# Patient Record
Sex: Female | Born: 1935 | Race: White | Hispanic: No | Smoking: Never smoker
Health system: Southern US, Community
[De-identification: ages and names within clinical notes are randomized; demographics above are authoritative.]

## PROBLEM LIST (undated history)

## (undated) DIAGNOSIS — Z9851 Tubal ligation status: Secondary | ICD-10-CM

## (undated) DIAGNOSIS — I2510 Atherosclerotic heart disease of native coronary artery without angina pectoris: Secondary | ICD-10-CM

## (undated) DIAGNOSIS — K449 Diaphragmatic hernia without obstruction or gangrene: Secondary | ICD-10-CM

## (undated) DIAGNOSIS — R58 Hemorrhage, not elsewhere classified: Secondary | ICD-10-CM

## (undated) DIAGNOSIS — I219 Acute myocardial infarction, unspecified: Secondary | ICD-10-CM

## (undated) DIAGNOSIS — I10 Essential (primary) hypertension: Secondary | ICD-10-CM

## (undated) DIAGNOSIS — Z923 Personal history of irradiation: Secondary | ICD-10-CM

## (undated) DIAGNOSIS — Z9862 Peripheral vascular angioplasty status: Secondary | ICD-10-CM

## (undated) DIAGNOSIS — M545 Low back pain, unspecified: Secondary | ICD-10-CM

## (undated) DIAGNOSIS — C719 Malignant neoplasm of brain, unspecified: Secondary | ICD-10-CM

## (undated) DIAGNOSIS — E7800 Pure hypercholesterolemia, unspecified: Secondary | ICD-10-CM

## (undated) DIAGNOSIS — C9000 Multiple myeloma not having achieved remission: Secondary | ICD-10-CM

## (undated) DIAGNOSIS — C439 Malignant melanoma of skin, unspecified: Secondary | ICD-10-CM

## (undated) DIAGNOSIS — E785 Hyperlipidemia, unspecified: Secondary | ICD-10-CM

## (undated) DIAGNOSIS — D649 Anemia, unspecified: Secondary | ICD-10-CM

## (undated) DIAGNOSIS — R600 Localized edema: Secondary | ICD-10-CM

## (undated) DIAGNOSIS — E789 Disorder of lipoprotein metabolism, unspecified: Secondary | ICD-10-CM

## (undated) HISTORY — DX: Atherosclerotic heart disease of native coronary artery without angina pectoris: I25.10

## (undated) HISTORY — DX: Tubal ligation status: Z98.51

## (undated) HISTORY — DX: Peripheral vascular angioplasty status: Z98.62

## (undated) HISTORY — DX: Hemorrhage, not elsewhere classified: R58

## (undated) HISTORY — DX: Anemia, unspecified: D64.9

## (undated) HISTORY — DX: Low back pain: M54.5

## (undated) HISTORY — DX: Low back pain, unspecified: M54.50

## (undated) HISTORY — DX: Diaphragmatic hernia without obstruction or gangrene: K44.9

## (undated) HISTORY — DX: Personal history of irradiation: Z92.3

## (undated) HISTORY — PX: OTHER SURGICAL HISTORY: SHX169

## (undated) HISTORY — DX: Localized edema: R60.0

## (undated) HISTORY — DX: Essential (primary) hypertension: I10

## (undated) HISTORY — DX: Hyperlipidemia, unspecified: E78.5

## (undated) HISTORY — PX: ABDOMINAL HYSTERECTOMY: SHX81

## (undated) HISTORY — DX: Malignant neoplasm of brain, unspecified: C71.9

---

## 2007-05-05 DIAGNOSIS — Z9862 Peripheral vascular angioplasty status: Secondary | ICD-10-CM

## 2007-05-05 HISTORY — DX: Peripheral vascular angioplasty status: Z98.62

## 2008-02-01 ENCOUNTER — Other Ambulatory Visit: Payer: Self-pay

## 2008-02-01 ENCOUNTER — Emergency Department: Payer: Self-pay | Admitting: Emergency Medicine

## 2010-02-01 ENCOUNTER — Ambulatory Visit: Payer: Self-pay | Admitting: Oncology

## 2010-02-07 ENCOUNTER — Ambulatory Visit: Payer: Self-pay | Admitting: Oncology

## 2010-02-10 ENCOUNTER — Ambulatory Visit: Payer: Self-pay | Admitting: Oncology

## 2010-03-04 ENCOUNTER — Ambulatory Visit: Payer: Self-pay | Admitting: Oncology

## 2010-05-12 ENCOUNTER — Inpatient Hospital Stay (HOSPITAL_COMMUNITY)
Admission: EM | Admit: 2010-05-12 | Discharge: 2010-05-13 | Payer: Self-pay | Source: Home / Self Care | Attending: Internal Medicine | Admitting: Internal Medicine

## 2010-05-19 LAB — POCT I-STAT, CHEM 8
BUN: 25 mg/dL — ABNORMAL HIGH (ref 6–23)
Calcium, Ion: 1.12 mmol/L (ref 1.12–1.32)
Chloride: 108 mEq/L (ref 96–112)
Creatinine, Ser: 1.4 mg/dL — ABNORMAL HIGH (ref 0.4–1.2)
Glucose, Bld: 129 mg/dL — ABNORMAL HIGH (ref 70–99)
HCT: 36 % (ref 36.0–46.0)
Hemoglobin: 12.2 g/dL (ref 12.0–15.0)
Potassium: 4 mEq/L (ref 3.5–5.1)
Sodium: 142 mEq/L (ref 135–145)
TCO2: 23 mmol/L (ref 0–100)

## 2010-05-19 LAB — COMPREHENSIVE METABOLIC PANEL
ALT: 12 U/L (ref 0–35)
AST: 18 U/L (ref 0–37)
Albumin: 3 g/dL — ABNORMAL LOW (ref 3.5–5.2)
Alkaline Phosphatase: 66 U/L (ref 39–117)
BUN: 21 mg/dL (ref 6–23)
CO2: 26 mEq/L (ref 19–32)
Calcium: 8.8 mg/dL (ref 8.4–10.5)
Chloride: 103 mEq/L (ref 96–112)
Creatinine, Ser: 1.37 mg/dL — ABNORMAL HIGH (ref 0.4–1.2)
GFR calc Af Amer: 45 mL/min — ABNORMAL LOW (ref >60)
GFR calc non Af Amer: 37 mL/min — ABNORMAL LOW (ref >60)
Glucose, Bld: 154 mg/dL — ABNORMAL HIGH (ref 70–99)
Potassium: 3.8 mEq/L (ref 3.5–5.1)
Sodium: 139 mEq/L (ref 135–145)
Total Bilirubin: 0.4 mg/dL (ref 0.3–1.2)
Total Protein: 6.2 g/dL (ref 6.0–8.3)

## 2010-05-19 LAB — CBC
HCT: 35.8 % — ABNORMAL LOW (ref 36.0–46.0)
HCT: 33.7 % — ABNORMAL LOW (ref 36.0–46.0)
Hemoglobin: 11.7 g/dL — ABNORMAL LOW (ref 12.0–15.0)
Hemoglobin: 11.1 g/dL — ABNORMAL LOW (ref 12.0–15.0)
MCH: 29.6 pg (ref 26.0–34.0)
MCH: 29.5 pg (ref 26.0–34.0)
MCHC: 32.7 g/dL (ref 30.0–36.0)
MCHC: 32.9 g/dL (ref 30.0–36.0)
MCV: 90.6 fL (ref 78.0–100.0)
MCV: 89.6 fL (ref 78.0–100.0)
Platelets: 213 10*3/uL (ref 150–400)
Platelets: 191 10*3/uL (ref 150–400)
RBC: 3.95 MIL/uL (ref 3.87–5.11)
RBC: 3.76 MIL/uL — ABNORMAL LOW (ref 3.87–5.11)
RDW: 13.1 % (ref 11.5–15.5)
RDW: 13 % (ref 11.5–15.5)
WBC: 7.5 10*3/uL (ref 4.0–10.5)
WBC: 5.8 10*3/uL (ref 4.0–10.5)

## 2010-05-19 LAB — PROTIME-INR
INR: 0.83 (ref 0.00–1.49)
Prothrombin Time: 11.6 seconds (ref 11.6–15.2)

## 2010-05-19 LAB — BASIC METABOLIC PANEL
BUN: 22 mg/dL (ref 6–23)
CO2: 23 mEq/L (ref 19–32)
Calcium: 9.1 mg/dL (ref 8.4–10.5)
Chloride: 108 mEq/L (ref 96–112)
Creatinine, Ser: 1.25 mg/dL — ABNORMAL HIGH (ref 0.4–1.2)
GFR calc Af Amer: 50 mL/min — ABNORMAL LOW (ref >60)
GFR calc non Af Amer: 42 mL/min — ABNORMAL LOW (ref >60)
Glucose, Bld: 133 mg/dL — ABNORMAL HIGH (ref 70–99)
Potassium: 4 mEq/L (ref 3.5–5.1)
Sodium: 140 mEq/L (ref 135–145)

## 2010-05-19 LAB — DIFFERENTIAL
Eosinophils Absolute: 0.5 10*3/uL (ref 0.0–0.7)
Eosinophils Relative: 6 % — ABNORMAL HIGH (ref 0–5)
Lymphs Abs: 2.8 10*3/uL (ref 0.7–4.0)
Monocytes Absolute: 0.7 10*3/uL (ref 0.1–1.0)
Monocytes Relative: 10 % (ref 3–12)
Neutrophils Relative %: 46 % (ref 43–77)

## 2010-05-19 LAB — CARDIAC PANEL(CRET KIN+CKTOT+MB+TROPI)
CK, MB: 3.8 ng/mL (ref 0.3–4.0)
CK, MB: 3.2 ng/mL (ref 0.3–4.0)
CK, MB: 2.9 ng/mL (ref 0.3–4.0)
Relative Index: INVALID (ref 0.0–2.5)
Relative Index: INVALID (ref 0.0–2.5)
Relative Index: INVALID (ref 0.0–2.5)
Total CK: 77 U/L (ref 7–177)
Total CK: 77 U/L (ref 7–177)
Total CK: 63 U/L (ref 7–177)
Troponin I: 0.02 ng/mL (ref 0.00–0.06)
Troponin I: 0.01 ng/mL (ref 0.00–0.06)
Troponin I: 0.02 ng/mL (ref 0.00–0.06)

## 2010-05-19 LAB — APTT: aPTT: 28 seconds (ref 24–37)

## 2010-05-19 LAB — POCT CARDIAC MARKERS
CKMB, poc: 2.5 ng/mL (ref 1.0–8.0)
Myoglobin, poc: 148 ng/mL (ref 12–200)
Troponin i, poc: <0.05 ng/mL (ref 0.00–0.09)

## 2010-05-19 LAB — GLUCOSE, CAPILLARY
Glucose-Capillary: 113 mg/dL — ABNORMAL HIGH (ref 70–99)
Glucose-Capillary: 140 mg/dL — ABNORMAL HIGH (ref 70–99)
Glucose-Capillary: 144 mg/dL — ABNORMAL HIGH (ref 70–99)
Glucose-Capillary: 153 mg/dL — ABNORMAL HIGH (ref 70–99)
Glucose-Capillary: 113 mg/dL — ABNORMAL HIGH (ref 70–99)
Glucose-Capillary: 121 mg/dL — ABNORMAL HIGH (ref 70–99)

## 2010-05-19 LAB — RAPID STREP SCREEN (MED CTR MEBANE ONLY): Streptococcus, Group A Screen (Direct): NEGATIVE

## 2010-05-19 LAB — BRAIN NATRIURETIC PEPTIDE: Pro B Natriuretic peptide (BNP): 100 pg/mL (ref 0.0–100.0)

## 2010-10-16 ENCOUNTER — Observation Stay (HOSPITAL_COMMUNITY)
Admission: EM | Admit: 2010-10-16 | Discharge: 2010-10-18 | Disposition: A | Discharge status: Home / Self Care | Payer: Medicare Other | Attending: Surgery | Admitting: Surgery

## 2010-10-16 ENCOUNTER — Inpatient Hospital Stay (INDEPENDENT_AMBULATORY_CARE_PROVIDER_SITE_OTHER)
Admission: RE | Admit: 2010-10-16 | Discharge: 2010-10-16 | Disposition: A | Discharge status: Home / Self Care | Payer: Medicare Other | Source: Ambulatory Visit | Attending: Family Medicine | Admitting: Family Medicine

## 2010-10-16 ENCOUNTER — Encounter (HOSPITAL_COMMUNITY): Payer: Self-pay | Admitting: Radiology

## 2010-10-16 ENCOUNTER — Emergency Department (HOSPITAL_COMMUNITY): Payer: Medicare Other

## 2010-10-16 DIAGNOSIS — R58 Hemorrhage, not elsewhere classified: Principal | ICD-10-CM | POA: Insufficient documentation

## 2010-10-16 DIAGNOSIS — Z85820 Personal history of malignant melanoma of skin: Secondary | ICD-10-CM | POA: Insufficient documentation

## 2010-10-16 DIAGNOSIS — E669 Obesity, unspecified: Secondary | ICD-10-CM | POA: Insufficient documentation

## 2010-10-16 DIAGNOSIS — R1031 Right lower quadrant pain: Secondary | ICD-10-CM | POA: Insufficient documentation

## 2010-10-16 DIAGNOSIS — C439 Malignant melanoma of skin, unspecified: Secondary | ICD-10-CM

## 2010-10-16 DIAGNOSIS — E279 Disorder of adrenal gland, unspecified: Secondary | ICD-10-CM | POA: Insufficient documentation

## 2010-10-16 DIAGNOSIS — D62 Acute posthemorrhagic anemia: Secondary | ICD-10-CM | POA: Insufficient documentation

## 2010-10-16 DIAGNOSIS — R609 Edema, unspecified: Secondary | ICD-10-CM | POA: Insufficient documentation

## 2010-10-16 DIAGNOSIS — R599 Enlarged lymph nodes, unspecified: Secondary | ICD-10-CM | POA: Insufficient documentation

## 2010-10-16 DIAGNOSIS — I10 Essential (primary) hypertension: Secondary | ICD-10-CM | POA: Insufficient documentation

## 2010-10-16 DIAGNOSIS — Z79899 Other long term (current) drug therapy: Secondary | ICD-10-CM | POA: Insufficient documentation

## 2010-10-16 HISTORY — DX: Acute myocardial infarction, unspecified: I21.9

## 2010-10-16 HISTORY — DX: Pure hypercholesterolemia, unspecified: E78.00

## 2010-10-16 HISTORY — DX: Disorder of lipoprotein metabolism, unspecified: E78.9

## 2010-10-16 HISTORY — DX: Malignant melanoma of skin, unspecified: C43.9

## 2010-10-16 LAB — POCT URINALYSIS DIP (DEVICE)
Glucose, UA: NEGATIVE mg/dL
Ketones, ur: NEGATIVE mg/dL
Leukocytes, UA: NEGATIVE
Nitrite: NEGATIVE
Protein, ur: 30 mg/dL — AB
Specific Gravity, Urine: 1.02 (ref 1.005–1.030)
Urobilinogen, UA: 0.2 mg/dL (ref 0.0–1.0)
pH: 5 (ref 5.0–8.0)

## 2010-10-16 LAB — URINALYSIS, ROUTINE W REFLEX MICROSCOPIC
Hgb urine dipstick: NEGATIVE
Nitrite: NEGATIVE
Protein, ur: NEGATIVE mg/dL
Specific Gravity, Urine: 1.023 (ref 1.005–1.030)
Urobilinogen, UA: 0.2 mg/dL (ref 0.0–1.0)

## 2010-10-16 LAB — COMPREHENSIVE METABOLIC PANEL
ALT: 12 U/L (ref 0–35)
AST: 21 U/L (ref 0–37)
CO2: 26 mEq/L (ref 19–32)
Calcium: 9.4 mg/dL (ref 8.4–10.5)
Chloride: 102 mEq/L (ref 96–112)
Creatinine, Ser: 0.98 mg/dL (ref 0.4–1.2)
GFR calc Af Amer: >60 mL/min (ref >60)
GFR calc non Af Amer: 55 mL/min — ABNORMAL LOW (ref >60)
Glucose, Bld: 108 mg/dL — ABNORMAL HIGH (ref 70–99)
Total Bilirubin: 0.4 mg/dL (ref 0.3–1.2)

## 2010-10-16 LAB — CBC
Hemoglobin: 11.5 g/dL — ABNORMAL LOW (ref 12.0–15.0)
MCH: 29 pg (ref 26.0–34.0)
MCV: 85.6 fL (ref 78.0–100.0)
RBC: 3.97 MIL/uL (ref 3.87–5.11)
WBC: 9.4 10*3/uL (ref 4.0–10.5)

## 2010-10-16 LAB — TYPE AND SCREEN
ABO/RH(D): O NEG
Antibody Screen: POSITIVE
DAT, IgG: NEGATIVE

## 2010-10-16 LAB — APTT: aPTT: 30 seconds (ref 24–37)

## 2010-10-16 LAB — PROTIME-INR: INR: 0.98 (ref 0.00–1.49)

## 2010-10-16 LAB — URINE MICROSCOPIC-ADD ON

## 2010-10-16 MED ORDER — IOHEXOL 300 MG/ML  SOLN
100.0000 mL | Freq: Once | INTRAMUSCULAR | Status: AC | PRN
  Administered 2010-10-16: 100 mL via INTRAVENOUS

## 2010-10-17 DIAGNOSIS — C439 Malignant melanoma of skin, unspecified: Secondary | ICD-10-CM

## 2010-10-17 LAB — BASIC METABOLIC PANEL
CO2: 28 mEq/L (ref 19–32)
Glucose, Bld: 101 mg/dL — ABNORMAL HIGH (ref 70–99)
Potassium: 3.5 mEq/L (ref 3.5–5.1)
Sodium: 139 mEq/L (ref 135–145)

## 2010-10-17 LAB — CBC
Hemoglobin: 9.1 g/dL — ABNORMAL LOW (ref 12.0–15.0)
Hemoglobin: 9.6 g/dL — ABNORMAL LOW (ref 12.0–15.0)
MCH: 29.1 pg (ref 26.0–34.0)
MCV: 86.1 fL (ref 78.0–100.0)
RBC: 3.2 MIL/uL — ABNORMAL LOW (ref 3.87–5.11)
RBC: 3.3 MIL/uL — ABNORMAL LOW (ref 3.87–5.11)

## 2010-10-17 LAB — URINE CULTURE: Colony Count: 5000

## 2010-10-18 DIAGNOSIS — R609 Edema, unspecified: Secondary | ICD-10-CM

## 2010-10-18 LAB — CBC
MCH: 29.1 pg (ref 26.0–34.0)
Platelets: 128 10*3/uL — ABNORMAL LOW (ref 150–400)
RBC: 3.09 MIL/uL — ABNORMAL LOW (ref 3.87–5.11)
RDW: 13.3 % (ref 11.5–15.5)
WBC: 6 10*3/uL (ref 4.0–10.5)

## 2010-10-21 LAB — METANEPHRINES, PLASMA: Normetanephrine, Free: 57 pg/mL (ref <=148)

## 2010-10-22 NOTE — Consult Note (Signed)
Kathleen Vargas, Kathleen Vargas             ACCOUNT NO.:  [id number]  MEDICAL RECORD NO.:  [id number]  LOCATION:  5504                         FACILITY:  [facility]  PHYSICIAN:  Ladene Artist, M.D.  DATE OF BIRTH:  Nov 26, 1931  DATE OF CONSULTATION:  10/17/2010 DATE OF DISCHARGE:  10/18/2010                                CONSULTATION   REASON FOR CONSULTATION:  Adrenal mass.  CONSULTING PHYSICIAN:  Ladene Artist, MD.  DATE OF CONSULTATION:  October 17, 2010.  PROGRESSING PHYSICIANS:  [facility].  PRIMARY PHYSICIAN:  Dr. Welton Flakes in [place].  HISTORY OF PRESENT ILLNESS:  Kathleen Vargas is a very pleasant 77 year old white female with history of a recent lesion removed from her right lower extremity, described by her as "melanoma."  She was in her usual state of health, until acute onset of right lower quadrant and right flank pain accompanied by nausea, vomiting and fever, for which she had required admission for further evaluation.  A CT of the abdomen and pelvis on October 16, 2010, with contrast was positive for a right retroperitoneal hemorrhage possibly arising from the right adrenal gland, with a 3-cm mass concurrency.  In addition, a heterogeneous soft tissue in the right inguinal region favoring the necrotic lymph node, was seen.  Based on these findings, we were asked to see her in consultation, to help in the determine if indeed there is a relationship between the melanoma diagnosis, and the new findings.  PAST MEDICAL HISTORY: 1. CAD, status post MI in 2009. 2. Hypertension. 3. Hyperlipidemia. 4. Interstitial airspace disease. 5. Hiatal hernia, moderate. 6. History of melanoma as per patient report. 7. Anemia.  SURGERY: 1. Status post right posterior leg lesion resection by Dr. Cheree Ditto,     rule out melanoma. 2. Status post total abdominal hysterectomy. 3. Status post bilateral tubal ligation. 4. Status post angioplasty in 2009. 5. Left knee  surgery.  ALLERGIES:  SULFA, NORVASC AND COREG.  MEDICATIONS:  Morphine and Zofran.  REVIEW OF SYSTEMS:  Remarkable for dyspnea on exertion, right flank pain, decreased appetite, unknown amount of weight loss.  The patient states that this weight loss has been of likely related to stress at home after her son suffer a head injury a couple of months ago.  She also has significant fatigue.  FAMILY HISTORY:  Noncontributory.  SOCIAL HISTORY:  The patient is single.  She has one son as mentioned above, he has recently suffered a head injury, and he is in the nursing facility, for recovery.  Lives in [place].  She is full code.  She is retired from [employer] where she worked for 33 years.  No tobacco, alcohol or recreational drug history.  Never had any colonoscopy or a mammogram.  PHYSICAL EXAMINATION:  GENERAL:  This is a well-developed, well- nourished 77 year old white female in no acute distress, alert and oriented x3. VITAL SIGNS:  Blood pressure 127/68, pulse 76, respirations 20, temperature 98.4, O2 sats 97% in room air.  Weight 81.3 kg, height 61 inches. HEENT:  Normocephalic, atraumatic.  Sclerae anicteric.  Oral cavity without thrush or lesions. NECK:  Supple.  No cervical or supraclavicular masses. LUNGS:  With a few rhonchi at  the posterior base, otherwise clear. CARDIOVASCULAR:  Regular rate and rhythm without murmurs, rubs or gallops. ABDOMEN:  Remarkable for right lower quadrant tenderness, but no masses and no hepatosplenomegaly. EXTREMITIES:  With 1+ right lower extremity edema.  No clubbing or cyanosis.  Of note, there is a 2-3 cm palpable right inguinal node.  No other nodes are palpable.  Also seen, there is a well-healed posterior calf incisional area, in the left lower extremity where the resection took place.  LABORATORY DATA:  Hemoglobin 9.1, hematocrit 27.5, white count 4.9, platelets 125.  Sodium 139, potassium 3.5, BUN 19, creatinine 0.92, glucose  101, total bilirubin 0.4, alkaline phosphatase 83, AST 21, ALT 12, total protein 6.9, albumin 3.7, calcium 8.5.  UA with 30 of protein, trace of blood, small leukocytes small bilirubin.  IMPRESSION: 1. Right hemorrhage, retroperitoneal. 2. Right adrenal mass. 3. Right inguinal adenopathy. 4. Report of melanoma removed from the right lower extremity in 2012. 5. Anemia likely secondary to retroperitoneal bleed. 6. History of coronary artery disease, status post myocardial     infarction with PTCA in 2009. 7. Right lower extremity edema, questionably related to     lymphadenopathy, rule out DVT.  Dopplers are pending.  RECOMMENDATIONS: 1. Acute management of retroperitoneal bleed per Surgery. 2. Obtain records from the right lower extremity biopsy in [facility]. 3. Check the Doppler results of the right lower extremity. 4. Schedule a biopsy of the right inguinal node per Interventional     Radiology.  This can be done as an outpatient. 5. We will schedule outpatient follow up at the cancer center.  Please call Oncology as needed over the weekend.  Otherwise, we will check her on October 20, 2010 if she is still in the hospital.  Thank you very much for allowing [place] the opportunity to participate in the care of Kathleen Vargas.     Marlowe Kays, PA-C   ______________________________ Ladene Artist, M.D.    SW/MEDQ  D:  10/19/2010  T:  10/20/2010  Job:  [id number]  cc:   Dr. Cheree Ditto  Electronically Signed by Marlowe Kays P.A. on 10/20/2010 08:04:51 AM Electronically Signed by Thornton Papas M.D. on 10/22/2010 09:18:47 AM

## 2010-10-24 ENCOUNTER — Other Ambulatory Visit: Payer: Self-pay | Admitting: Oncology

## 2010-10-24 DIAGNOSIS — R1909 Other intra-abdominal and pelvic swelling, mass and lump: Secondary | ICD-10-CM

## 2010-10-27 ENCOUNTER — Other Ambulatory Visit: Payer: Self-pay | Admitting: Oncology

## 2010-10-29 ENCOUNTER — Ambulatory Visit (HOSPITAL_COMMUNITY)
Admission: RE | Admit: 2010-10-29 | Discharge: 2010-10-29 | Disposition: A | Discharge status: Home / Self Care | Payer: Medicare Other | Source: Ambulatory Visit | Attending: Oncology | Admitting: Oncology

## 2010-10-29 ENCOUNTER — Other Ambulatory Visit: Payer: Self-pay | Admitting: Interventional Radiology

## 2010-10-29 DIAGNOSIS — E278 Other specified disorders of adrenal gland: Secondary | ICD-10-CM | POA: Insufficient documentation

## 2010-10-29 DIAGNOSIS — Z79899 Other long term (current) drug therapy: Secondary | ICD-10-CM | POA: Insufficient documentation

## 2010-10-29 DIAGNOSIS — I10 Essential (primary) hypertension: Secondary | ICD-10-CM | POA: Insufficient documentation

## 2010-10-29 DIAGNOSIS — R599 Enlarged lymph nodes, unspecified: Secondary | ICD-10-CM | POA: Insufficient documentation

## 2010-10-29 DIAGNOSIS — C4370 Malignant melanoma of unspecified lower limb, including hip: Secondary | ICD-10-CM | POA: Insufficient documentation

## 2010-10-29 LAB — CBC
MCHC: 33.1 g/dL (ref 30.0–36.0)
MCV: 87.8 fL (ref 78.0–100.0)
Platelets: 237 10*3/uL (ref 150–400)
RDW: 13.8 % (ref 11.5–15.5)
WBC: 5.9 10*3/uL (ref 4.0–10.5)

## 2010-10-29 LAB — APTT: aPTT: 31 seconds (ref 24–37)

## 2010-10-30 ENCOUNTER — Encounter: Payer: Medicare Other | Admitting: Oncology

## 2010-10-30 ENCOUNTER — Encounter (HOSPITAL_COMMUNITY)
Admission: RE | Admit: 2010-10-30 | Discharge: 2010-10-30 | Disposition: A | Discharge status: Home / Self Care | Payer: Medicare Other | Source: Ambulatory Visit | Attending: Oncology | Admitting: Oncology

## 2010-10-30 ENCOUNTER — Encounter (HOSPITAL_COMMUNITY): Payer: Self-pay

## 2010-10-30 DIAGNOSIS — C4370 Malignant melanoma of unspecified lower limb, including hip: Secondary | ICD-10-CM | POA: Insufficient documentation

## 2010-10-30 DIAGNOSIS — Z79899 Other long term (current) drug therapy: Secondary | ICD-10-CM | POA: Insufficient documentation

## 2010-10-30 DIAGNOSIS — R1909 Other intra-abdominal and pelvic swelling, mass and lump: Secondary | ICD-10-CM | POA: Insufficient documentation

## 2010-10-30 DIAGNOSIS — E279 Disorder of adrenal gland, unspecified: Secondary | ICD-10-CM | POA: Insufficient documentation

## 2010-10-30 DIAGNOSIS — L989 Disorder of the skin and subcutaneous tissue, unspecified: Secondary | ICD-10-CM | POA: Insufficient documentation

## 2010-10-30 MED ORDER — FLUDEOXYGLUCOSE F - 18 (FDG) INJECTION
17.4000 | Freq: Once | INTRAVENOUS | Status: AC | PRN
  Administered 2010-10-30: 17.4 via INTRAVENOUS

## 2010-11-03 ENCOUNTER — Encounter (HOSPITAL_BASED_OUTPATIENT_CLINIC_OR_DEPARTMENT_OTHER): Payer: Medicare Other | Admitting: Oncology

## 2010-11-03 DIAGNOSIS — C7949 Secondary malignant neoplasm of other parts of nervous system: Secondary | ICD-10-CM

## 2010-11-03 DIAGNOSIS — C439 Malignant melanoma of skin, unspecified: Secondary | ICD-10-CM

## 2010-11-11 ENCOUNTER — Ambulatory Visit (HOSPITAL_COMMUNITY)
Admission: RE | Admit: 2010-11-11 | Discharge: 2010-11-11 | Disposition: A | Discharge status: Home / Self Care | Payer: Medicare Other | Source: Ambulatory Visit | Attending: Oncology | Admitting: Oncology

## 2010-11-11 ENCOUNTER — Other Ambulatory Visit: Payer: Self-pay | Admitting: Oncology

## 2010-11-11 DIAGNOSIS — L989 Disorder of the skin and subcutaneous tissue, unspecified: Secondary | ICD-10-CM

## 2010-11-11 DIAGNOSIS — M79609 Pain in unspecified limb: Secondary | ICD-10-CM | POA: Insufficient documentation

## 2010-11-11 DIAGNOSIS — M7989 Other specified soft tissue disorders: Secondary | ICD-10-CM | POA: Insufficient documentation

## 2010-11-11 NOTE — Discharge Summary (Signed)
Kathleen Vargas, Kathleen Vargas             ACCOUNT NO.:  [id number]  MEDICAL RECORD NO.:  [id number]  LOCATION:  5504                         FACILITY:  [facility]  PHYSICIAN:  Anselm Pancoast. Ryota Treece, M.D.DATE OF BIRTH:  [birth year]  DATE OF ADMISSION:  10/16/2010 DATE OF DISCHARGE:  10/18/2010                              DISCHARGE SUMMARY   ADMITTING PHYSICIAN:  Mary Sella. Andrey Campanile, MD  CONSULTING ONCOLOGIST:  Ladene Artist, MD  HISTORY OF PRESENT ILLNESS:  Kathleen Vargas is a 77 year old female with a prior history of melanoma hypertension, hyperlipidemia, and a prior history of MI in 2009.  She required angioplasty for her MI.  Her medications include aspirin, lisinopril, Lopressor, and pravastatin. She developed rather sudden onset of right-sided flank pain with mild nausea and one episode of vomiting.  She went to the [facility] and complained of this pain and they sent her to the emergency department for a CT scan.  CT scan showed evidence of right-sided retroperitoneal hemorrhage with evidence of a 4 cm right adrenal gland mass.  The adrenal mass measured 4.3.  Additionally there was a 3 cm nodule or lymph node in the right inguinal region additionally. Otherwise no acute findings were found.  The patient's admission hemoglobin was 11.5 and subsequently dropped to 9.1.  Decision was made to admit the patient for observation and workup.  Given her prior history of melanoma, these lesions of the abdomen and groin are concerning for metastatic melanoma.  SUMMARY OF HOSPITAL COURSE:  The patient was admitted on October 16, 2010. Serial hemoglobins were drawn.  The patient stabilized from 9.1 to 9.6 to 9.0.  Her pain improved within the first 24 hours and she felt significantly better.  However, given the concern for possible metastatic process, Dr. Truett Perna was asked to consult on the patient. His recommendations were noted including probable outpatient biopsy of the inguinal lymph node  versus outpatient PET CT to rule out metastatic disease process.  Additionally, the patient has some right-sided lower extremity edema.  Therefore, a venous duplex was ordered which was negative for acute DVT.  This was presumed to be from the lymphadenopathy and her groin causing secondary lymphedema on that right lower extremity.  At this point, we feel the patient is stable for discharge.  Her hemoglobin has stabilized.  She is asymptomatic with regards to pain.  She has tolerated diet and her vital signs are hemodynamically stable.  DISCHARGE DIAGNOSES: 1. Retroperitoneal hemorrhage/hematoma secondary to adrenal mass     thought to be possible metastatic melanoma. 2. Acute blood loss anemia secondary to retroperitoneal     hemorrhage/hematoma. 3. Right inguinal lymphadenopathy/nodule - again thought to possibly     be involved with metastatic melanoma process. 4. Right lower extremity edema secondary to lymphedema.  Venous duplex     negative for deep venous thrombosis.  DISCHARGE MEDICATIONS:  The patient will resume her home medications including lisinopril/HCT 10/12.5 one tablet daily, metoprolol 50 mg one tablet twice daily, multivitamin daily, pravastatin 40 mg daily, enteric coated 81 mg aspirin will be started daily on Monday June 18.  The patient will be contacted by Dr. Kalman Drape office for followup as an outpatient in addition  for the lymph node biopsy.  She can follow up with Dr. Michaell Cowing or Dr. Andrey Campanile at [facility] in several weeks sooner if felt requested by the oncology team.     Brayton El, PA-C   ______________________________ Anselm Pancoast. Zachery Dakins, M.D.    KB/MEDQ  D:  10/18/2010  T:  10/18/2010  Job:  [id number]  Electronically Signed by Brayton El  on 10/27/2010 02:38:39 PM Electronically Signed by Consuello Bossier M.D. on 11/11/2010 03:47:32 PM

## 2010-11-12 NOTE — H&P (Signed)
Kathleen Vargas, Kathleen Vargas             ACCOUNT NO.:  [id number]  MEDICAL RECORD NO.:  [id number]  LOCATION:  5504                         FACILITY:  [facility]  PHYSICIAN:  Mary Sella. Andrey Campanile, MD     DATE OF BIRTH:  Mar 19, 1932  DATE OF ADMISSION:  10/16/2010 DATE OF DISCHARGE:                             HISTORY & PHYSICAL   ADMITTING SERVICE:  [facility].  PRIMARY CARE PHYSICIAN:  Dr. Vickii Penna.  DERMATOLOGISTS:  Dr. Cheree Ditto and Dr. Berenda Morale.  REASON FOR ADMISSION:  Right retroperitoneal hemorrhage secondary to right adrenal mass.  CHIEF COMPLAINT:  Right side pain.  HISTORY OF PRESENT ILLNESS:  Kathleen Vargas is a 77 year old obese Caucasian female who awoke around 2:00 a.m. this morning with right flank pain.  She had never had any previous symptoms.  She then developed some nausea and one episode of emesis.  Because of the continued discomfort, she went to urgent care.  The pain subsequently resolved.  She described it as sharp.  It was 10/10 at its worst.  She was sent to the emergency room for a CT scan.  She denies any weight loss.  She denies any melena, hematochezia, dysuria, hematuria, or hematemesis.  She is not on any blood thinners other than baby aspirin. She denies any trauma to the area.  She is currently not having any abdominal pain or right-sided pain currently.  Interestingly, she states that she had a melanoma excised from her right leg by Dr. Cheree Ditto a few months ago.  She is unsure of the depth or size of melanoma.  When asked about followup, she is unsure she elected not to follow up.  She said that she has a known "knot" in her right groin. When asked her how long it had been there, she is unsure.  She says that Dr. Cheree Ditto talked to her about having somebody may be cut it out but might have leakage from the incision and would need to wear bag.  She has never had a colonoscopy or mammogram in her life.  Other than her history of melanoma, she denies any  personal history of lung cancer, renal cell cancer, breast cancer, hepatocellular cancer, or lymphoma. She denies any family history of cancer.  She does endorse a little bit of polyuria, but she does take a diuretic.  She denies any chest pain, shortness of breath, orthopnea, or paroxysmal nocturnal dyspnea.  PAST MEDICAL HISTORY: 1. Obesity. 2. Hyperlipidemia. 3. Hypertension. 4. History of stridor. 5. Myocardial infarction in 2009. 6. Melanoma.  PAST SURGICAL HISTORY: 1. Angioplasty in 2009. 2. Bilateral tubal ligation. 3. Abdominal hysterectomy. 4. Left knee surgery.  MEDICATIONS:  Aspirin, lisinopril/hydrochlorothiazide, Lopressor, pravastatin.  ALLERGIES:  SULFA and possibly AMLODIPINE.  SOCIAL HISTORY:  She denies any drugs, alcohol, or tobacco use.  She lives at home with one of her sons and a grandson.  FAMILY HISTORY:  No cancer.  REVIEW OF SYSTEMS:  A comprehensive 12-point review of systems was performed.  All systems are negative except for what is mentioned in the HPI.  She does endorse some dyspnea on exertion.PHYSICAL EXAMINATION:  VITAL SIGNS:  Temperature 98.3, pulse 94, blood pressure 157/86, respirations 18, satting 98% on  room air. GENERAL:  Caucasian female appearing in stated age, in no apparent distress. HEENT:  Atraumatic, normocephalic.  Pupils are equal and round.  No scleral icterus.  No external ear lesions.  Hearing grossly normal. NECK:  Supple.  No lymphadenopathy.  No supraclavicular lymphadenopathy. PULMONARY:  Lungs are clear.  Symmetric chest rise.  No accessory use of muscles. CARDIOVASCULAR:  Regular rate and rhythm.  A 2+ radial pulse.  A 2+ femoral pulse. ABDOMEN:  Obese, soft, nontender, nondistended.  A well-healed lower midline incision.  No incisional hernia.  No CVA tenderness. SKIN:  No jaundice.  No rash.  She does have bilateral lower extremity edema.  In the left lower leg, it is trace.  In the right leg, it is probably  1+.  She has a well-healed upper right posterior calf transverse incision that is about 4 cm.  On her left upper chest, she has hypertrophic scar that is about 3 cm.  No signs of infection. NEURO:  Nonfocal.  Sensation grossly intact. PSYCHIATRIC:  Alert and oriented x3.  Judgment seems appropriate for her age.  Not too much of good insight into her medical history. LYMPH:  No supraclavicular, cervical, axillary lymphadenopathy.  She does have shotty one somewhat enlarged lymph node in her right groin which is palpable. BREASTS:  Done in the presence of a chaperone show symmetric breasts. No obvious skin changes or nipple changes.  No palpable breast masses.  LABORATORY DATA:  Sodium 139, potassium 3.8, chloride 102, bicarb 26, BUN 22, creatinine 0.98, blood sugar 108, calcium 9.4, total bilirubin 0.4, AST 21, ALT 12, alk phos 83, protein 6.9, albumin 3.7.  Hemoglobin 11.5, hematocrit 34, platelet count 196, white count 9.4.  Urinalysis shows small bilirubin, small leukocyte esterase, and rare wbc's, otherwise, negative.  RADIOGRAPHS:  CT scan of the abdomen and pelvis was personally reviewed by me and discussed with radiologist.  She has a right retroperitoneal hemorrhage with no active signs of extravasation, probably secondary to a right adrenal gland.  She has right adrenal mass measuring 2.6 x 4.3 cm.  She has a 1.3-cm nodule in the right inferior adrenal gland.  She has a right lower pole kidney lesion that is 3.9 cm which is cystic. There is no retroperitoneal or retrocrural adenopathy.  She has a right inguinal nodule measuring 2.9 x 2 cm with central hypoattenuation. There is also a moderate-sized hiatal hernia.  IMPRESSION:  A 77 year old Caucasian female with a right adrenal mass with retroperitoneal hemorrhage with no signs of active bleeding and right inguinal lymphadenopathy with a known history of right lower leg melanoma, obesity, hypertension, hyperlipidemia, coronary  artery disease.  PLAN:  With a reported history of a melanoma with adrenal lesion and a large right inguinal lymph node, this is concerning for metastatic melanoma disease; however, I do think we still need to rule out for functional versus nonfunctional adrenal tumors.  I think it would be very unusual for this to be a functional adrenal tumor, but we will rule out pheochromocytoma since with plasma free metanephrines. She is on some antihypertensives which may skew the results, but we will order a plasma free metanephrines.  I do recommend overnight observation at least in order to repeat a CBC in the morning to monitor for signs of bleeding.  Otherwise, I think if she is stable overnight and her pain stays resolved, then she can be discharged home with outpatient followup.  We are going to hold VT prophylaxis secondary to the risk  of bleeding.  She is going to need to stop her aspirin.  Once we rule out a pheo and discussion with radiologist, I would recommended an outpatient PET CT scan next week to see if this lesion is hypermetabolic.  If it is hypermetabolic, then this is definitely concerning for metastatic disease more than likely from her melanoma, nonetheless, we do need to get her records from Dr. Cheree Ditto to see the depth of the melanoma that she excised as well as to see the pathology report.  If the PET scan is indeterminate, then a CT protocol adrenal scan can be ordered with a possibility of biopsy of her inguinal lymph node.  Additional metabolic workup can then be performed if the PET CT is negative.     Mary Sella. Andrey Campanile, MD     EMW/MEDQ  D:  10/16/2010  T:  10/17/2010  Job:  [id number]  cc:   Dr. Vickii Penna. Dr. Cheree Ditto  Electronically Signed by Gaynelle Adu M.D. on 11/12/2010 09:02:03 AM

## 2010-11-13 ENCOUNTER — Other Ambulatory Visit: Payer: Self-pay | Admitting: Oncology

## 2010-11-13 ENCOUNTER — Ambulatory Visit (HOSPITAL_COMMUNITY)
Admission: RE | Admit: 2010-11-13 | Discharge: 2010-11-13 | Disposition: A | Discharge status: Home / Self Care | Payer: Medicare Other | Source: Ambulatory Visit | Attending: Oncology | Admitting: Oncology

## 2010-11-13 ENCOUNTER — Ambulatory Visit (HOSPITAL_COMMUNITY): Payer: Medicare Other

## 2010-11-13 ENCOUNTER — Other Ambulatory Visit: Payer: Self-pay | Admitting: Interventional Radiology

## 2010-11-13 DIAGNOSIS — C439 Malignant melanoma of skin, unspecified: Secondary | ICD-10-CM | POA: Insufficient documentation

## 2010-11-13 DIAGNOSIS — L989 Disorder of the skin and subcutaneous tissue, unspecified: Secondary | ICD-10-CM

## 2010-11-13 DIAGNOSIS — R229 Localized swelling, mass and lump, unspecified: Secondary | ICD-10-CM | POA: Insufficient documentation

## 2010-11-13 LAB — CBC
Hemoglobin: 10.6 g/dL — ABNORMAL LOW (ref 12.0–15.0)
MCH: 28.8 pg (ref 26.0–34.0)
MCHC: 32.6 g/dL (ref 30.0–36.0)
MCV: 88.3 fL (ref 78.0–100.0)
RBC: 3.68 MIL/uL — ABNORMAL LOW (ref 3.87–5.11)

## 2010-11-26 ENCOUNTER — Other Ambulatory Visit: Payer: Self-pay | Admitting: Oncology

## 2010-11-26 ENCOUNTER — Encounter (HOSPITAL_BASED_OUTPATIENT_CLINIC_OR_DEPARTMENT_OTHER): Payer: Medicare Other | Admitting: Oncology

## 2010-11-26 DIAGNOSIS — C7931 Secondary malignant neoplasm of brain: Secondary | ICD-10-CM

## 2010-11-26 DIAGNOSIS — C439 Malignant melanoma of skin, unspecified: Secondary | ICD-10-CM

## 2010-12-09 ENCOUNTER — Other Ambulatory Visit: Payer: Self-pay | Admitting: Oncology

## 2010-12-09 ENCOUNTER — Encounter (HOSPITAL_COMMUNITY): Payer: Self-pay

## 2010-12-09 ENCOUNTER — Encounter (HOSPITAL_BASED_OUTPATIENT_CLINIC_OR_DEPARTMENT_OTHER): Payer: Medicare Other | Admitting: Oncology

## 2010-12-09 ENCOUNTER — Ambulatory Visit (HOSPITAL_COMMUNITY)
Admission: RE | Admit: 2010-12-09 | Discharge: 2010-12-09 | Disposition: A | Discharge status: Home / Self Care | Payer: Medicare Other | Source: Ambulatory Visit | Attending: Oncology | Admitting: Oncology

## 2010-12-09 DIAGNOSIS — C439 Malignant melanoma of skin, unspecified: Secondary | ICD-10-CM

## 2010-12-09 DIAGNOSIS — C7949 Secondary malignant neoplasm of other parts of nervous system: Secondary | ICD-10-CM | POA: Insufficient documentation

## 2010-12-09 DIAGNOSIS — R209 Unspecified disturbances of skin sensation: Secondary | ICD-10-CM | POA: Insufficient documentation

## 2010-12-09 DIAGNOSIS — F29 Unspecified psychosis not due to a substance or known physiological condition: Secondary | ICD-10-CM | POA: Insufficient documentation

## 2010-12-09 DIAGNOSIS — C7931 Secondary malignant neoplasm of brain: Secondary | ICD-10-CM | POA: Insufficient documentation

## 2010-12-09 DIAGNOSIS — R29898 Other symptoms and signs involving the musculoskeletal system: Secondary | ICD-10-CM | POA: Insufficient documentation

## 2010-12-09 LAB — CMP (CANCER CENTER ONLY)
BUN, Bld: 21 mg/dL (ref 7–22)
CO2: 29 mEq/L (ref 18–33)
Creat: 1.1 mg/dl (ref 0.6–1.2)
Glucose, Bld: 95 mg/dL (ref 73–118)
Total Bilirubin: 0.6 mg/dl (ref 0.20–1.60)
Total Protein: 6.8 g/dL (ref 6.4–8.1)

## 2010-12-09 LAB — CBC WITH DIFFERENTIAL/PLATELET
Basophils Absolute: 0 10*3/uL (ref 0.0–0.1)
Eosinophils Absolute: 0.2 10*3/uL (ref 0.0–0.5)
HCT: 35.1 % (ref 34.8–46.6)
LYMPH%: 32.8 % (ref 14.0–49.7)
MCV: 86.4 fL (ref 79.5–101.0)
MONO#: 0.3 10*3/uL (ref 0.1–0.9)
MONO%: 7.4 % (ref 0.0–14.0)
NEUT#: 2.6 10*3/uL (ref 1.5–6.5)
NEUT%: 56.1 % (ref 38.4–76.8)
Platelets: 192 10*3/uL (ref 145–400)
WBC: 4.6 10*3/uL (ref 3.9–10.3)

## 2010-12-09 MED ORDER — IOHEXOL 300 MG/ML  SOLN
100.0000 mL | Freq: Once | INTRAMUSCULAR | Status: AC | PRN
  Administered 2010-12-09: 100 mL via INTRAVENOUS

## 2010-12-10 ENCOUNTER — Ambulatory Visit
Admission: RE | Admit: 2010-12-10 | Discharge: 2010-12-10 | Disposition: A | Discharge status: Home / Self Care | Payer: Medicare Other | Source: Ambulatory Visit | Attending: Radiation Oncology | Admitting: Radiation Oncology

## 2010-12-10 DIAGNOSIS — I10 Essential (primary) hypertension: Secondary | ICD-10-CM | POA: Insufficient documentation

## 2010-12-10 DIAGNOSIS — C7970 Secondary malignant neoplasm of unspecified adrenal gland: Secondary | ICD-10-CM | POA: Insufficient documentation

## 2010-12-10 DIAGNOSIS — C4359 Malignant melanoma of other part of trunk: Secondary | ICD-10-CM | POA: Insufficient documentation

## 2010-12-10 DIAGNOSIS — C719 Malignant neoplasm of brain, unspecified: Secondary | ICD-10-CM

## 2010-12-10 DIAGNOSIS — I2510 Atherosclerotic heart disease of native coronary artery without angina pectoris: Secondary | ICD-10-CM | POA: Insufficient documentation

## 2010-12-10 DIAGNOSIS — Z510 Encounter for antineoplastic radiation therapy: Secondary | ICD-10-CM | POA: Insufficient documentation

## 2010-12-10 DIAGNOSIS — Z9861 Coronary angioplasty status: Secondary | ICD-10-CM | POA: Insufficient documentation

## 2010-12-10 DIAGNOSIS — C7949 Secondary malignant neoplasm of other parts of nervous system: Secondary | ICD-10-CM | POA: Insufficient documentation

## 2010-12-10 DIAGNOSIS — C7931 Secondary malignant neoplasm of brain: Secondary | ICD-10-CM | POA: Insufficient documentation

## 2010-12-10 DIAGNOSIS — E785 Hyperlipidemia, unspecified: Secondary | ICD-10-CM | POA: Insufficient documentation

## 2010-12-10 DIAGNOSIS — I252 Old myocardial infarction: Secondary | ICD-10-CM | POA: Insufficient documentation

## 2010-12-10 HISTORY — DX: Malignant neoplasm of brain, unspecified: C71.9

## 2010-12-16 ENCOUNTER — Other Ambulatory Visit: Payer: Self-pay | Admitting: Radiation Oncology

## 2010-12-16 ENCOUNTER — Encounter (HOSPITAL_BASED_OUTPATIENT_CLINIC_OR_DEPARTMENT_OTHER): Payer: Medicare Other | Admitting: Oncology

## 2010-12-16 DIAGNOSIS — C439 Malignant melanoma of skin, unspecified: Secondary | ICD-10-CM

## 2010-12-16 DIAGNOSIS — C7931 Secondary malignant neoplasm of brain: Secondary | ICD-10-CM

## 2010-12-19 ENCOUNTER — Ambulatory Visit
Admission: RE | Admit: 2010-12-19 | Discharge: 2010-12-19 | Disposition: A | Discharge status: Home / Self Care | Payer: Medicare Other | Source: Ambulatory Visit | Attending: Radiation Oncology | Admitting: Radiation Oncology

## 2010-12-19 DIAGNOSIS — C7931 Secondary malignant neoplasm of brain: Secondary | ICD-10-CM

## 2010-12-19 MED ORDER — GADOBENATE DIMEGLUMINE 529 MG/ML IV SOLN
16.0000 mL | Freq: Once | INTRAVENOUS | Status: AC | PRN
  Administered 2010-12-19: 16 mL via INTRAVENOUS

## 2010-12-26 DIAGNOSIS — Z923 Personal history of irradiation: Secondary | ICD-10-CM

## 2010-12-26 HISTORY — DX: Personal history of irradiation: Z92.3

## 2011-01-17 ENCOUNTER — Emergency Department (HOSPITAL_COMMUNITY)
Admission: EM | Admit: 2011-01-17 | Discharge: 2011-01-18 | Disposition: A | Discharge status: Home / Self Care | Payer: Medicare Other | Attending: Emergency Medicine | Admitting: Emergency Medicine

## 2011-01-17 DIAGNOSIS — E7800 Pure hypercholesterolemia, unspecified: Secondary | ICD-10-CM | POA: Insufficient documentation

## 2011-01-17 DIAGNOSIS — R1011 Right upper quadrant pain: Secondary | ICD-10-CM | POA: Insufficient documentation

## 2011-01-17 DIAGNOSIS — Z79899 Other long term (current) drug therapy: Secondary | ICD-10-CM | POA: Insufficient documentation

## 2011-01-17 DIAGNOSIS — R112 Nausea with vomiting, unspecified: Secondary | ICD-10-CM | POA: Insufficient documentation

## 2011-01-17 DIAGNOSIS — K859 Acute pancreatitis without necrosis or infection, unspecified: Secondary | ICD-10-CM | POA: Insufficient documentation

## 2011-01-18 ENCOUNTER — Emergency Department (HOSPITAL_COMMUNITY): Payer: Medicare Other

## 2011-01-18 LAB — CBC
MCV: 86.4 fL (ref 78.0–100.0)
Platelets: 158 10*3/uL (ref 150–400)
RBC: 4.18 MIL/uL (ref 3.87–5.11)
WBC: 11.1 10*3/uL — ABNORMAL HIGH (ref 4.0–10.5)

## 2011-01-18 LAB — COMPREHENSIVE METABOLIC PANEL
ALT: 29 U/L (ref 0–35)
AST: 18 U/L (ref 0–37)
CO2: 27 mEq/L (ref 19–32)
Chloride: 100 mEq/L (ref 96–112)
Creatinine, Ser: 0.91 mg/dL (ref 0.50–1.10)
GFR calc non Af Amer: 60 mL/min — ABNORMAL LOW (ref >60)
Sodium: 138 mEq/L (ref 135–145)
Total Bilirubin: 0.3 mg/dL (ref 0.3–1.2)

## 2011-01-18 LAB — DIFFERENTIAL
Eosinophils Absolute: 0 10*3/uL (ref 0.0–0.7)
Lymphs Abs: 1.5 10*3/uL (ref 0.7–4.0)
Neutro Abs: 8.9 10*3/uL — ABNORMAL HIGH (ref 1.7–7.7)
Neutrophils Relative %: 80 % — ABNORMAL HIGH (ref 43–77)

## 2011-01-18 LAB — URINALYSIS, ROUTINE W REFLEX MICROSCOPIC
Glucose, UA: NEGATIVE mg/dL
Leukocytes, UA: NEGATIVE
Protein, ur: NEGATIVE mg/dL
Specific Gravity, Urine: 1.017 (ref 1.005–1.030)
pH: 6 (ref 5.0–8.0)

## 2011-01-23 ENCOUNTER — Encounter (HOSPITAL_BASED_OUTPATIENT_CLINIC_OR_DEPARTMENT_OTHER): Payer: Medicare Other | Admitting: Oncology

## 2011-01-23 DIAGNOSIS — C439 Malignant melanoma of skin, unspecified: Secondary | ICD-10-CM

## 2011-01-23 DIAGNOSIS — C7931 Secondary malignant neoplasm of brain: Secondary | ICD-10-CM

## 2011-01-26 ENCOUNTER — Ambulatory Visit
Admission: RE | Admit: 2011-01-26 | Discharge: 2011-01-26 | Disposition: A | Discharge status: Home / Self Care | Payer: Medicare Other | Source: Ambulatory Visit | Attending: Radiation Oncology | Admitting: Radiation Oncology

## 2011-01-27 ENCOUNTER — Other Ambulatory Visit: Payer: Self-pay | Admitting: Radiation Oncology

## 2011-01-27 DIAGNOSIS — C7931 Secondary malignant neoplasm of brain: Secondary | ICD-10-CM

## 2011-02-23 ENCOUNTER — Encounter (HOSPITAL_BASED_OUTPATIENT_CLINIC_OR_DEPARTMENT_OTHER): Payer: Medicare Other | Admitting: Oncology

## 2011-02-23 DIAGNOSIS — R609 Edema, unspecified: Secondary | ICD-10-CM

## 2011-02-23 DIAGNOSIS — C7931 Secondary malignant neoplasm of brain: Secondary | ICD-10-CM

## 2011-02-23 DIAGNOSIS — C439 Malignant melanoma of skin, unspecified: Secondary | ICD-10-CM

## 2011-02-23 DIAGNOSIS — M25559 Pain in unspecified hip: Secondary | ICD-10-CM

## 2011-03-17 ENCOUNTER — Other Ambulatory Visit: Payer: Self-pay | Admitting: Radiation Oncology

## 2011-03-19 ENCOUNTER — Ambulatory Visit
Admission: RE | Admit: 2011-03-19 | Discharge: 2011-03-19 | Disposition: A | Discharge status: Home / Self Care | Payer: Medicare Other | Source: Ambulatory Visit | Attending: Radiation Oncology | Admitting: Radiation Oncology

## 2011-03-19 ENCOUNTER — Other Ambulatory Visit: Payer: Medicare Other

## 2011-03-19 DIAGNOSIS — C7931 Secondary malignant neoplasm of brain: Secondary | ICD-10-CM

## 2011-03-19 MED ORDER — GADOBENATE DIMEGLUMINE 529 MG/ML IV SOLN
8.0000 mL | Freq: Once | INTRAVENOUS | Status: AC | PRN
  Administered 2011-03-19: 8 mL via INTRAVENOUS

## 2011-03-19 NOTE — Progress Notes (Signed)
Quick Note:  Susan, please set-up SRS ______ 

## 2011-03-23 ENCOUNTER — Encounter: Payer: Self-pay | Admitting: *Deleted

## 2011-03-23 ENCOUNTER — Ambulatory Visit
Admission: RE | Admit: 2011-03-23 | Discharge: 2011-03-23 | Disposition: A | Discharge status: Home / Self Care | Payer: Medicare Other | Source: Ambulatory Visit | Attending: Radiation Oncology | Admitting: Radiation Oncology

## 2011-03-23 ENCOUNTER — Ambulatory Visit: Admission: RE | Admit: 2011-03-23 | Payer: Medicare Other | Source: Ambulatory Visit

## 2011-03-23 ENCOUNTER — Ambulatory Visit
Admission: RE | Admit: 2011-03-23 | Discharge: 2011-03-23 | Disposition: A | Discharge status: Home / Self Care | Payer: Medicare Other | Source: Ambulatory Visit | Attending: Neurosurgery | Admitting: Neurosurgery

## 2011-03-23 ENCOUNTER — Telehealth: Payer: Self-pay | Admitting: *Deleted

## 2011-03-23 ENCOUNTER — Encounter: Payer: Self-pay | Admitting: Radiation Oncology

## 2011-03-23 DIAGNOSIS — E785 Hyperlipidemia, unspecified: Secondary | ICD-10-CM | POA: Insufficient documentation

## 2011-03-23 DIAGNOSIS — I10 Essential (primary) hypertension: Secondary | ICD-10-CM | POA: Insufficient documentation

## 2011-03-23 DIAGNOSIS — Z9861 Coronary angioplasty status: Secondary | ICD-10-CM | POA: Insufficient documentation

## 2011-03-23 DIAGNOSIS — C7970 Secondary malignant neoplasm of unspecified adrenal gland: Secondary | ICD-10-CM | POA: Insufficient documentation

## 2011-03-23 DIAGNOSIS — C4359 Malignant melanoma of other part of trunk: Secondary | ICD-10-CM | POA: Insufficient documentation

## 2011-03-23 DIAGNOSIS — I252 Old myocardial infarction: Secondary | ICD-10-CM | POA: Insufficient documentation

## 2011-03-23 DIAGNOSIS — Z510 Encounter for antineoplastic radiation therapy: Secondary | ICD-10-CM | POA: Insufficient documentation

## 2011-03-23 DIAGNOSIS — C7931 Secondary malignant neoplasm of brain: Secondary | ICD-10-CM | POA: Insufficient documentation

## 2011-03-23 DIAGNOSIS — C7949 Secondary malignant neoplasm of other parts of nervous system: Secondary | ICD-10-CM | POA: Insufficient documentation

## 2011-03-23 DIAGNOSIS — I2510 Atherosclerotic heart disease of native coronary artery without angina pectoris: Secondary | ICD-10-CM | POA: Insufficient documentation

## 2011-03-23 NOTE — Telephone Encounter (Signed)
Called pt for her pharmacy info to call in Dexamethasone per Dr Kathrynn Running. She states she told dr she did not want to take steroid anymore. She states she completed steroid 2-3 wks ago and "it made her feel awful". She states she will not pick it up and resume. Asked pt if she had med for nausea. She states she may have 1 tab left "but it didn't do much good". Advised pt will inform dr and call her back.

## 2011-03-23 NOTE — Progress Notes (Signed)
Planning and follow-up  This office note has been dictated.

## 2011-03-24 ENCOUNTER — Telehealth: Payer: Self-pay | Admitting: *Deleted

## 2011-03-24 NOTE — Telephone Encounter (Signed)
Informed pt, per Dr Kathrynn Running she does not have to take steroid although dr feels it would be helpful w/her nausea. Pt states she is having less nausea today, did not want med called in for this. Asked pt what she is using for itching. She states she is applying lotion w/relief. Advised pt to call if she develops any problems or experiences episodes of nausea again. Pt verbalized understanding.

## 2011-04-01 ENCOUNTER — Telehealth: Payer: Self-pay | Admitting: Radiation Oncology

## 2011-04-01 ENCOUNTER — Ambulatory Visit
Admission: RE | Admit: 2011-04-01 | Discharge: 2011-04-01 | Disposition: A | Discharge status: Home / Self Care | Payer: Medicare Other | Source: Ambulatory Visit | Attending: Radiation Oncology | Admitting: Radiation Oncology

## 2011-04-01 ENCOUNTER — Encounter: Payer: Self-pay | Admitting: Radiation Oncology

## 2011-04-01 VITALS — BP 149/89 | HR 97 | Temp 97.6°F | Resp 20

## 2011-04-01 DIAGNOSIS — Z923 Personal history of irradiation: Secondary | ICD-10-CM

## 2011-04-01 DIAGNOSIS — C7931 Secondary malignant neoplasm of brain: Secondary | ICD-10-CM

## 2011-04-01 HISTORY — DX: Personal history of irradiation: Z92.3

## 2011-04-01 NOTE — Telephone Encounter (Signed)
04/01/2011 at 1638. Patient phoned this writer concerned she might be having a radiation treatment reaction. Patient states,"Twice since I left treatment today I have felt like someone was standing behind me holding me down." Patient denies any other symptoms such as pain, double vision, headache, etc. Assured her this feeling was not radiation induced. Patient verbalized her grandson was present in the home with her. Encouraged patient to present to the emergency department if she felt distressed. Patient verbalized understanding.

## 2011-04-01 NOTE — Progress Notes (Signed)
Pt denies headache, nausea; has no c/o. Son with pt. Will recheck HR/BP in 15 min.  1:32pm Pt states she "is fine". HR/BP rechecked, coming down.  2:06pm Pt has no c/o. VS checked per Dr Kathrynn Running; pt released. Has fu appts.

## 2011-04-01 NOTE — Progress Notes (Signed)
  Radiation Oncology         [phone]) [phone] ________________________________  Stereotactic Treatment Procedure Note  Name: Kathleen Vargas             MRN: [id number]  Date: 04/01/2011  DOB: 09/03/1931  Status:  Outpatient  CC: Myrtice Lauth, Perfecto [place],*  SPECIAL TREATMENT PROCEDURE:  Kathleen Vargas was brought to the TrueBeam stereotactic radiation treatment machine and placed supine on the CT couch. Her  head frame was applied, and She was set up for stereotactic radiosurgery.  Neurosurgery was present for the set-up and delivery  SIMULATION VERIFICATION:  In the couch zero-angle position, the patient underwent Exactrac imaging using the Brainlab system with orthogonal KV images.  These were carefully aligned and repeated to confirm treatment position for each of the isocenter.  At the zero angle, a  conebeam CT was also performed for 3D verification.  The Exactrac snap film verification was repeated at each couch angle.  SPECIAL TREATMENT PROCEDURE: Kathleen Vargas received stereotactic radiosurgery to the following targets:  A total of 10 individual brain metastases were treated with RapidArc using one circumferential axial arc and 4 vertex half arcs.  It was treated using 5 Rapid Arc VMAT Beams with 10 MegaVolt Flattening Filter Free photon beams to a prescription dose of 20 Gy.  ExacTrac Snap verification was performed for each couch angle.  STEREOTACTIC TREATMENT MANAGEMENT:  Following delivery, the patient was transported to nursing in stable condition and monitored for possible acute effects.  Vital signs were recorded.  The patient tolerated treatment without significant acute effects, and was discharged to home in stable condition.  We discussed steroids, but the patient would prefer to use OTC meds for headaches, and will call if worsens.  She will also use prn Lasix for her LE edema.  PLAN: Follow-up in one month. ________________________________  Artist Pais.  Kathrynn Running, M.D.

## 2011-04-08 ENCOUNTER — Telehealth: Payer: Self-pay | Admitting: Radiation Oncology

## 2011-04-08 ENCOUNTER — Other Ambulatory Visit: Payer: Self-pay | Admitting: Radiation Oncology

## 2011-04-08 NOTE — Telephone Encounter (Signed)
Mary, I entered order for compazine, but there is no connected pharmacy.  Can you please add pharmacy or call in script. Thanks.  MM

## 2011-04-08 NOTE — Telephone Encounter (Signed)
Called to check on the patient after her SRS and she said that she is still feeling nauseous and would like to have something to help with that. She also has had some headache but nothing that persisted or was too painful. I let her know that I would pass this message along to Dr. Kathrynn Running and Colen Darling RN to address the nausea question.

## 2011-04-08 NOTE — Telephone Encounter (Signed)
Erroneous Encounter

## 2011-04-09 ENCOUNTER — Other Ambulatory Visit: Payer: Self-pay | Admitting: *Deleted

## 2011-04-09 NOTE — Progress Notes (Signed)
Encounter addended by: Carmela Hurt on: 04/09/2011  2:03 PM<BR>     Documentation filed: Notes Section

## 2011-04-09 NOTE — Telephone Encounter (Signed)
Pt's pharmacy is [facility], [phone]. The order has no disp #, or refill #. Please provide this info and I'll call med in. Thank you.

## 2011-04-09 NOTE — Op Note (Signed)
*   No surgery found *  1:54 PM  PATIENT:  Kathleen Vargas  77 y.o. female  PRE-OPERATIVE DIAGNOSIS:  Metastatic Brain tumors POST-OPERATIVE DIAGNOSIS: Metastatic Brain tumors  PROCEDURE:  Radiosurgery for metastatic brain tumors   SURGEON:  Coletta Memos  ASSISTANTS:none  ANESTHESIA:   none  EBL:     BLOOD ADMINISTERED:none  CELL SAVER GIVEN:none  COUNT:not applicable  DRAINS: none   SPECIMEN:  No Specimen  DICTATION: Marvie Brevik was brought to the Tru beam radiation suite. She was positioned on the treatment couch and her position verified with the exact tract system. Custom was placed without difficulty. All preoperative planning was discussed with the radiation oncologist prior to treatment. We confirmed our treatment plan and proceeded.  There are 10 targets which are being treated. We used a rapid arc protocol with one circumferential arc and 4 vertex half arcs. Each lesion was prescribed a treatment dose of 20 Gy. Exacttrac verification was performed before each treatment arc. She tolerated the procedure without difficulty.   PLAN OF CARE: discharge to home after clinic observation  PATIENT DISPOSITION:  Rad onc clinic   Delay start of Pharmacological VTE agent (>24hrs) due to surgical blood loss or risk of bleeding:  not applicable

## 2011-04-09 NOTE — Telephone Encounter (Signed)
Called in.

## 2011-04-10 NOTE — Progress Notes (Signed)
CC:   Coletta Memos, M.D. Ladene Artist, M.D.  DIAGNOSIS:  A 77 year old woman with multiple brain metastases from metastatic melanoma.  INTERVAL SINCE LAST RADIATION:  3 months.  NARRATIVE:  [name] received stereotactic radiosurgery on August 24th to a 16-mm left parietal, 7-mm right temporal, 2-mm left cerebellar, and 4-mm right medial temporal brain metastasis.  She returned last week for her 1st followup MRI at 3 months.  This study showed regression of the 4 treated lesions.  However, it did show multiple new small brain metastases measuring up to 4 mm.  The patient has been suffering with some recent headaches, as well as some pruritus of her legs.  PHYSICAL FINDINGS:  The patient is neurologically grossly intact.  Vital signs were taken today and are in the electronic medical record.  She is afebrile.  She does have itching of the legs, but no apparent rash.  RADIOGRAPHIC FINDINGS:  The patient's MRI was reviewed with her and her son today.  IMPRESSION:  [name] is a very nice 77 year old woman with multiple new brain metastases from metastatic melanoma.  We talked about potential treatment options today, including whole-brain radiotherapy versus stereotactic radiosurgery versus supportive care.  The patient is interested in proceeding with salvage stereotactic radiosurgery. Accordingly, she provided informed written consent and treatment plan was performed.  SIMULATION AND TREATMENT PLANNING:  The patient was brought to the radiation planning suite.  She provided informed written consent.  IV access was considered, but the patient's creatinine was elevated and intravenous contrast was not used.  Then, she was set up on the CT couch and an Aquaplast thermoplastic reinforced head frame was applied for stereotactic positioning.  Then, CT images were obtained and uploaded to the planning software, where they were fused with the patient's MRI.  At this point, the  target volumes were delineated and a 3D conformal stereotactic radiosurgery plan has been requested to treat each of the new brain metastases to 20 Gy in a single fraction.    ______________________________ Oneita Hurt, M.D. MAM/MEDQ  D:  03/23/2011  T:  04/10/2011  Job:  [id number]

## 2011-04-13 ENCOUNTER — Encounter: Payer: Self-pay | Admitting: Radiation Oncology

## 2011-04-13 NOTE — Progress Notes (Signed)
  Radiation Oncology         (336) [phone] ________________________________  Name: Natalea Sutliff MRN: [id number]  Date: 04/13/2011  DOB: 30-Nov-1931  Status:outpatient   CC: Mancel Bale, MD    Coletta Memos, MD  DIAGNOSIS: 77 year old woman with 11 new small brain mets from melanoma  INDICATION FOR TREATMENT: Palliative  TREATMENT DATES:  04/01/11   SITE/DOSE:  20 Gy to PTV 1 Rt Frontal,  PTV2 Lt Parietal, PTV3 Lt Parietal, PTV4 Rt Temporal, PTV5 Rt Frontal, PTV6 Rt Parietal, PTV7 Rt Frontal, PTV8 Rt Frontal, PTV9 Rt Parietal, PTV10 Rt Occipit, PTV11 Rt Occipit   BEAMS/ENERGY:  Five arcs were used to deliver RapidArc treatment to these 11 separate targets in one plan.  ExacTrac positioning was achieved with orthogonal x-rays and the robotic couch  NARRATIVE:  No complications were noted.  PLAN: Routine followup in one month. Patient instructed to call if questions or worsening complaints in interim. ________________________________  Artist Pais Kathrynn Running, M.D.

## 2011-05-01 ENCOUNTER — Ambulatory Visit (HOSPITAL_BASED_OUTPATIENT_CLINIC_OR_DEPARTMENT_OTHER): Payer: Medicare Other | Admitting: Oncology

## 2011-05-01 ENCOUNTER — Telehealth: Payer: Self-pay | Admitting: Oncology

## 2011-05-01 VITALS — BP 152/84 | HR 97 | Temp 97.0°F | Wt 170.6 lb

## 2011-05-01 DIAGNOSIS — C801 Malignant (primary) neoplasm, unspecified: Secondary | ICD-10-CM

## 2011-05-01 DIAGNOSIS — C7931 Secondary malignant neoplasm of brain: Secondary | ICD-10-CM

## 2011-05-01 DIAGNOSIS — C439 Malignant melanoma of skin, unspecified: Secondary | ICD-10-CM

## 2011-05-01 DIAGNOSIS — C7949 Secondary malignant neoplasm of other parts of nervous system: Secondary | ICD-10-CM

## 2011-05-01 MED ORDER — METOCLOPRAMIDE HCL 10 MG PO TABS: 10.0000 mg | ORAL_TABLET | Freq: Three times a day (TID) | ORAL | Status: AC

## 2011-05-01 MED ORDER — TRAMADOL HCL 50 MG PO TABS: 50.0000 mg | ORAL_TABLET | Freq: Four times a day (QID) | ORAL | Status: AC | PRN

## 2011-05-01 NOTE — Progress Notes (Signed)
OFFICE PROGRESS NOTE   INTERVAL HISTORY:   She returns as scheduled. She completed another treatment with stereotactic brain radiosurgery on November 28.  She complains of nausea and intermittent emesis. She has no neurologic symptoms. The right groin mass has increased in size and is now "tender ". The leg edema is partially improved. There are multiple cutaneous nodules over the chest and neck.  Her daughter died unexpectedly earlier this week . She has pain at the low back.   Objective:  Vital signs in last 24 hours:  Blood pressure 152/84, pulse 97, temperature 97 F (36.1 C), weight 170 lb 9.6 oz (77.384 kg).    HEENT: Neck without mass Lymphatics: There is a 3-4 cm firm mass in the right medial inguinal area. Resp: Lungs clear bilaterally Cardio: Regular rate and rhythm GI: No hepatomegaly. Nontender Vascular: 2+ edema at the right leg, 1+ edema at the left lower leg. Skin: Multiple "bluish "cutaneous nodules at the upper chest, back, and neck.   Lab Results:  Lab Results  Component Value Date   WBC 11.1* 01/18/2011   HGB 12.2 01/18/2011   HCT 36.1 01/18/2011   MCV 86.4 01/18/2011   PLT 158 01/18/2011      Medications: I have reviewed the patient's current medications.  Assessment/Plan: 1. Metastatic melanoma, status post resection of a right calf superficial spreading Clark level 4, Breslow measurement 4.25 mm melanoma with ulceration and a positive margin (T4b) on 01/31/2010.  This was followed by a re-excision on 07/01/2009 with no residual melanoma and negative surgical margins. 2. Retroperitoneal hemorrhage felt to arise in the right adrenal gland with a 4.3 cm mass noted in the right adrenal gland in June 2012. a. Abdominal ultrasound 01/18/2011 confirmed an increase in the size of the right adrenal mass. 3. Pain secondary to the retroperitoneal hemorrhage, resolved. 4. Acute abdominal pain 01/18/2011, ? related to the right adrenal mass, resolved. 5. Status  post biopsy of a right inguinal lymph node 10/29/2010, nondiagnostic. 6. PET scan 10/30/2010 with a hypermetabolic 10 mm nodule within the subcutaneous fat of the high right back, hypermetabolic superior aspect of the right adrenal gland, nodular linear uptake within the left and right lower pelvis just superior to the vaginal cuff, and a hypermetabolic right inguinal lymph node. 7. Status post biopsy of a right upper back subcutaneous nodule 11/13/2010 with the pathology confirming melanoma, BRAF wild type. 8. Right lower extremity edema with a venous Doppler negative for a DVT 10/18/2010.  A repeat Doppler of the right lower extremity 11/11/2010 was negative for a DVT or superficial thrombosis. 9. History of Anemia, likely related to the retroperitoneal hemorrhage. 10. Basal cell carcinoma of the left upper chest 01/31/2010, status post an excision 07/01/2010 with no residual basal cell carcinoma and negative margins. 11. History of a myocardial infarction in 2009, angioplasty in 2009. 12. Brain metastases noted on a staging CT of the brain 12/09/2010, status post stereotactic radiosurgery on 12/26/2010.      -Repeat stereotactic radiosurgery for treatment of multiple brain lesions on 04/01/2011. 13. Progressive bilateral lower extremity edema, likely related to obstructing lymphadenopathy in the abdomen/pelvis.  .   14. Pain at the low back-? Related to the right adrenal metastasis or other sites of metastatic disease involving the bones or soft tissue. 15. Nausea-potentially related to the brain metastases/stereotactic radiosurgery or GI metastases.   Disposition:  Ms. Beachem has progressive metastatic melanoma. The nausea could be related to brain metastases or metastatic disease involving the abdomen/GI tract.  She will try Reglan for the nausea. The patient or her son will contact us next week if the nausea has not improved and we will begin Decadron.  She will try tramadol for pain.  Ms.  Harp indicated she does not wish to begin systemic chemotherapy. I recommend a [facility] hospice referral. She and her son agree. I discussed CPR and ACLS issues with Ms. Standre. She will be placed on a no CODE BLUE status. She will return for an office visit in one month. We will see her sooner as needed.   Lucile Shutters, MD  05/01/2011  3:59 PM

## 2011-05-01 NOTE — Progress Notes (Signed)
Requested [facility] HIM department to fax chart information for [facility] to admissions dept at [phone]

## 2011-05-01 NOTE — Telephone Encounter (Signed)
gve the pt her jan 2013 aopt calendar

## 2011-05-15 ENCOUNTER — Encounter: Payer: Self-pay | Admitting: *Deleted

## 2011-05-15 DIAGNOSIS — R600 Localized edema: Secondary | ICD-10-CM | POA: Insufficient documentation

## 2011-05-15 DIAGNOSIS — Z923 Personal history of irradiation: Secondary | ICD-10-CM | POA: Insufficient documentation

## 2011-05-15 DIAGNOSIS — M545 Low back pain: Secondary | ICD-10-CM | POA: Insufficient documentation

## 2011-05-15 DIAGNOSIS — I2510 Atherosclerotic heart disease of native coronary artery without angina pectoris: Secondary | ICD-10-CM | POA: Insufficient documentation

## 2011-05-15 DIAGNOSIS — R58 Hemorrhage, not elsewhere classified: Secondary | ICD-10-CM | POA: Insufficient documentation

## 2011-05-15 DIAGNOSIS — I219 Acute myocardial infarction, unspecified: Secondary | ICD-10-CM | POA: Insufficient documentation

## 2011-05-15 DIAGNOSIS — E785 Hyperlipidemia, unspecified: Secondary | ICD-10-CM | POA: Insufficient documentation

## 2011-05-15 NOTE — Progress Notes (Signed)
Follow up Brain Radiation brain metastases  s/p metastatic melanoma Radiation treatment=12/26/10 4 sites brain Radiation treatment= 04/01/11 =right frontal site Widowed, son,    Allergies: Sulfa,Norvasc,Coreg   Pt declined chemotherapy per Dr. Truett Perna progress note 05/01/11, [facility] hospice referral and No Code BLue status  Pt still has nausea, says compazine isn't helped  Out of her nerve medication, was given 5mg  Valium the other day by so,stated"it really helped than the xanax"

## 2011-05-18 ENCOUNTER — Encounter: Payer: Self-pay | Admitting: Radiation Oncology

## 2011-05-18 ENCOUNTER — Ambulatory Visit
Admission: RE | Admit: 2011-05-18 | Discharge: 2011-05-18 | Disposition: A | Discharge status: Home / Self Care | Payer: Medicare Other | Source: Ambulatory Visit | Attending: Radiation Oncology | Admitting: Radiation Oncology

## 2011-05-18 DIAGNOSIS — F419 Anxiety disorder, unspecified: Secondary | ICD-10-CM

## 2011-05-18 DIAGNOSIS — C7931 Secondary malignant neoplasm of brain: Secondary | ICD-10-CM

## 2011-05-18 MED ORDER — LORAZEPAM 1 MG PO TABS: 1.0000 mg | ORAL_TABLET | Freq: Three times a day (TID) | ORAL | Status: AC

## 2011-05-18 NOTE — Progress Notes (Signed)
Radiation Oncology         (336) [phone] ________________________________  Name: Kathleen Vargas MRN: [id number]  Date: 05/18/2011  DOB: 09-27-1931       Follow-Up Visit Note  CC: No primary provider on file.  Kathleen Vargas,*  Diagnosis:   78 year old woman with metastases from melanoma  Interval Since Last Radiation:  2 months  Narrative . . . . . . . . The patient returns today for routine follow-up.  She returns today for short interval followup: Mr. radiosurgery for brain metastases. She is essentially without complaint. She has noted new subcutaneous nodules. The patient has been taken off active therapy is going through supportive care. She has not agreed to a hospice services quite. She presents today without complaint. Is not using steroids at this time. She does have a right-sided headache                              ALLERGIES:  is allergic to amlodipine; benazepril; carvedilol; coreg; dicloxacillin; norvasc; and sulfur.  Meds. . . . . . . . . . . . Current Outpatient Prescriptions  Medication Sig Dispense Refill  . ALPRAZolam (XANAX) 0.25 MG tablet Take 0.25 mg by mouth at bedtime as needed.        Marland Kitchen aspirin 81 MG tablet Take 81 mg by mouth daily.        . furosemide (LASIX) 20 MG tablet Take 20 mg by mouth daily as needed.        Marland Kitchen lisinopril-hydrochlorothiazide (PRINZIDE,ZESTORETIC) 10-12.5 MG per tablet Take 1 tablet by mouth daily.        . metoprolol (LOPRESSOR) 50 MG tablet Take 50 mg by mouth 2 (two) times daily.        . pravastatin (PRAVACHOL) 40 MG tablet Take 40 mg by mouth at bedtime.        . traMADol (ULTRAM) 50 MG tablet Take 50 mg by mouth every 6 (six) hours as needed.      Marland Kitchen LORazepam (ATIVAN) 1 MG tablet Take 1 tablet (1 mg total) by mouth every 8 (eight) hours.  60 tablet  2  . prochlorperazine (COMPAZINE) 10 MG tablet Take 10 mg by mouth every 6 (six) hours as needed. Take this if you experience nausea         Physical Findings. . .  weight is  166 lb 4.8 oz (75.433 kg). Her oral temperature is 97 F (36.1 C). Her blood pressure is 167/98 and her pulse is 93. Her respiration is 20. Marland Kitchen  No significant changes.  Her head is normocephalic and atraumatic. She is in no acute distress today. He underwent. Speech is fluent.  Extraocular muscles are intact  Lab Findings . . . . .  Lab Results  Component Value Date   WBC 11.1* 01/18/2011   HGB 12.2 01/18/2011   HCT 36.1 01/18/2011   MCV 86.4 01/18/2011   PLT 158 01/18/2011      Impression . . . . . . . The patient is recovering from the effects of radiation.  She is not interested in pursuing further active treatment at this time.  Plan . . . . . . . . . . . . today, I switched the patient from Xanax to Ativan or anxiety. Also givers advice regarding management nausea simply using ginger ale. Stay the concern Reglan or Zofran as needed and monitor her oxygen room was unsuccessful as. Otherwise,  followup with the patient a clinical basis in 2 months. Have not scheduled any followup MRIs at this time at her request.  _____________________________________  Kathleen Vargas, M.D.

## 2011-05-18 NOTE — Progress Notes (Signed)
Encounter addended by: Oneita Hurt, MD on: 05/18/2011  1:29 PM<BR>     Documentation filed: Notes Section

## 2011-05-28 ENCOUNTER — Other Ambulatory Visit: Payer: Self-pay | Admitting: *Deleted

## 2011-05-28 ENCOUNTER — Ambulatory Visit (HOSPITAL_BASED_OUTPATIENT_CLINIC_OR_DEPARTMENT_OTHER): Payer: Medicare Other | Admitting: Oncology

## 2011-05-28 ENCOUNTER — Telehealth: Payer: Self-pay | Admitting: Oncology

## 2011-05-28 VITALS — BP 133/83 | HR 90 | Temp 97.6°F | Ht 60.0 in | Wt 165.0 lb

## 2011-05-28 DIAGNOSIS — C439 Malignant melanoma of skin, unspecified: Secondary | ICD-10-CM

## 2011-05-28 NOTE — Progress Notes (Signed)
OFFICE PROGRESS NOTE   INTERVAL HISTORY:   She returns as scheduled. She has enrolled in the [facility] hospice program, but she is not receiving a regular nursing visit. She plans to contact the hospice nurse as needed. She denies pain. She has no new complaint. The right leg edema has improved.  Objective:  Vital signs in last 24 hours:  Blood pressure 133/83, pulse 90, temperature 97.6 F (36.4 C), temperature source Oral, height 5' (1.524 m), weight 165 lb (74.844 kg).    Lymphatics: Approximate 4 cm firm node in the medial right inguinal region Resp: Lungs clear bilaterally Cardio: Regular rate and rhythm GI: The abdomen is nontender, no hepatomegaly, no mass Vascular: Tense edema throughout the right leg  Skin: Multiple purplish cutaneous nodules at the neck and chest    Medications: I have reviewed the patient's current medications.  Assessment/Plan: 1. Metastatic melanoma, status post resection of a right calf superficial spreading Clark level 4, Breslow measurement 4.25 mm melanoma with ulceration and a positive margin (T4b) on 01/31/2010. This was followed by a re-excision on 07/01/2009 with no residual melanoma and negative surgical margins. 2. Retroperitoneal hemorrhage felt to arise in the right adrenal gland with a 4.3 cm mass noted in the right adrenal gland in June 2012. a. Abdominal ultrasound 01/18/2011 confirmed an increase in the size of the right adrenal mass. 3. Pain secondary to the retroperitoneal hemorrhage, resolved. 4. Acute abdominal pain 01/18/2011, ? related to the right adrenal mass, resolved. 5. Status post biopsy of a right inguinal lymph node 10/29/2010, nondiagnostic. 6. PET scan 10/30/2010 with a hypermetabolic 10 mm nodule within the subcutaneous fat of the high right back, hypermetabolic superior aspect of the right adrenal gland, nodular linear uptake within the left and right lower pelvis just superior to the vaginal cuff, and a hypermetabolic  right inguinal lymph node. 7. Status post biopsy of a right upper back subcutaneous nodule 11/13/2010 with the pathology confirming melanoma, BRAF wild type. 8. Right lower extremity edema with a venous Doppler negative for a DVT 10/18/2010. A repeat Doppler of the right lower extremity 11/11/2010 was negative for a DVT or superficial thrombosis. 9. History of Anemia, likely related to the retroperitoneal hemorrhage. 10. Basal cell carcinoma of the left upper chest 01/31/2010, status post an excision 07/01/2010 with no residual basal cell carcinoma and negative margins. 11. History of a myocardial infarction in 2009, angioplasty in 2009. 12. Brain metastases noted on a staging CT of the brain 12/09/2010, status post stereotactic radiosurgery on 12/26/2010. -Repeat stereotactic radiosurgery for treatment of multiple brain lesions on 04/01/2011. 13. Progressive bilateral lower extremity edema, likely related to obstructing lymphadenopathy in the abdomen/pelvis.    Disposition:  She appears to have an improved performance status. The cutaneous nodules have progressed. She will contact the hospice RN as needed. She will return for an office visit here in 6 weeks.   Lucile Shutters, MD  05/28/2011  3:46 PM

## 2011-06-09 ENCOUNTER — Telehealth: Payer: Self-pay | Admitting: *Deleted

## 2011-06-09 DIAGNOSIS — R110 Nausea: Secondary | ICD-10-CM

## 2011-06-09 MED ORDER — PROCHLORPERAZINE MALEATE 10 MG PO TABS: 10.0000 mg | ORAL_TABLET | Freq: Four times a day (QID) | ORAL | Status: AC | PRN

## 2011-06-09 NOTE — Telephone Encounter (Signed)
Patient left VM "I'm sick this morning. Can Dr. Truett Perna see me?".

## 2011-06-09 NOTE — Telephone Encounter (Signed)
Please have Hospice RN make home visit to assess nausea. She can try Compazine 10mg  every 6 hours as needed.

## 2011-06-09 NOTE — Telephone Encounter (Signed)
Made patient aware that Compazine script was called to her pharmacy. Suggested she be open to allow Hospice to come into home now to assist with symptom management. Call back if Compazine does not help.

## 2011-06-09 NOTE — Telephone Encounter (Signed)
MD out of office today. Called hospice triage nurse and requested they call patient to check on her today and assess her needs.

## 2011-06-09 NOTE — Telephone Encounter (Signed)
Patient reports feeling sick for past couple weeks--persistent constant nausea--not eating. No desire to eat.  Able to drink fluids in small amounts. Staying in bed most of the time. Asking for something for nausea and for MD to see her when he can. Prior Reglan 10 mg ac did not help her nausea at all. Chart has previous compazine order per radiation oncology, but she does not recall if this worked or not. She is having some stool output and passing gas. No pain.

## 2011-06-30 ENCOUNTER — Encounter (HOSPITAL_COMMUNITY): Payer: Self-pay

## 2011-06-30 ENCOUNTER — Inpatient Hospital Stay (HOSPITAL_COMMUNITY)
Admission: EM | Admit: 2011-06-30 | Discharge: 2011-07-03 | DRG: 055 | Disposition: A | Discharge status: Hospice | Payer: Medicare Other | Source: Ambulatory Visit | Attending: Internal Medicine | Admitting: Internal Medicine

## 2011-06-30 ENCOUNTER — Emergency Department (HOSPITAL_COMMUNITY): Payer: Medicare Other

## 2011-06-30 DIAGNOSIS — I252 Old myocardial infarction: Secondary | ICD-10-CM

## 2011-06-30 DIAGNOSIS — Z923 Personal history of irradiation: Secondary | ICD-10-CM

## 2011-06-30 DIAGNOSIS — R58 Hemorrhage, not elsewhere classified: Secondary | ICD-10-CM

## 2011-06-30 DIAGNOSIS — Z90710 Acquired absence of both cervix and uterus: Secondary | ICD-10-CM

## 2011-06-30 DIAGNOSIS — C439 Malignant melanoma of skin, unspecified: Secondary | ICD-10-CM

## 2011-06-30 DIAGNOSIS — C7931 Secondary malignant neoplasm of brain: Principal | ICD-10-CM | POA: Diagnosis present

## 2011-06-30 DIAGNOSIS — W19XXXA Unspecified fall, initial encounter: Secondary | ICD-10-CM | POA: Diagnosis present

## 2011-06-30 DIAGNOSIS — I2510 Atherosclerotic heart disease of native coronary artery without angina pectoris: Secondary | ICD-10-CM | POA: Diagnosis present

## 2011-06-30 DIAGNOSIS — Z882 Allergy status to sulfonamides status: Secondary | ICD-10-CM

## 2011-06-30 DIAGNOSIS — Z66 Do not resuscitate: Secondary | ICD-10-CM | POA: Diagnosis not present

## 2011-06-30 DIAGNOSIS — E785 Hyperlipidemia, unspecified: Secondary | ICD-10-CM

## 2011-06-30 DIAGNOSIS — R600 Localized edema: Secondary | ICD-10-CM

## 2011-06-30 DIAGNOSIS — R531 Weakness: Secondary | ICD-10-CM

## 2011-06-30 DIAGNOSIS — M545 Low back pain: Secondary | ICD-10-CM

## 2011-06-30 DIAGNOSIS — I219 Acute myocardial infarction, unspecified: Secondary | ICD-10-CM

## 2011-06-30 DIAGNOSIS — M6281 Muscle weakness (generalized): Secondary | ICD-10-CM | POA: Diagnosis present

## 2011-06-30 DIAGNOSIS — Z888 Allergy status to other drugs, medicaments and biological substances status: Secondary | ICD-10-CM

## 2011-06-30 DIAGNOSIS — Z85820 Personal history of malignant melanoma of skin: Secondary | ICD-10-CM

## 2011-06-30 HISTORY — DX: Multiple myeloma not having achieved remission: C90.00

## 2011-06-30 LAB — DIFFERENTIAL
Basophils Relative: 0 % (ref 0–1)
Eosinophils Absolute: 0 10*3/uL (ref 0.0–0.7)
Lymphs Abs: 1.2 10*3/uL (ref 0.7–4.0)
Neutro Abs: 4.1 10*3/uL (ref 1.7–7.7)
Neutrophils Relative %: 71 % (ref 43–77)

## 2011-06-30 LAB — COMPREHENSIVE METABOLIC PANEL
ALT: 7 U/L (ref 0–35)
AST: 19 U/L (ref 0–37)
Albumin: 3 g/dL — ABNORMAL LOW (ref 3.5–5.2)
Alkaline Phosphatase: 57 U/L (ref 39–117)
Potassium: 3.8 mEq/L (ref 3.5–5.1)
Sodium: 137 mEq/L (ref 135–145)
Total Protein: 5.9 g/dL — ABNORMAL LOW (ref 6.0–8.3)

## 2011-06-30 LAB — CBC
MCH: 23.1 pg — ABNORMAL LOW (ref 26.0–34.0)
MCHC: 31.4 g/dL (ref 30.0–36.0)
Platelets: 155 10*3/uL (ref 150–400)
RBC: 3.85 MIL/uL — ABNORMAL LOW (ref 3.87–5.11)

## 2011-06-30 MED ORDER — SODIUM CHLORIDE 0.9 % IV SOLN
INTRAVENOUS | Status: DC
  Administered 2011-06-30 (×2): via INTRAVENOUS
  Administered 2011-07-01: 1000 mL via INTRAVENOUS

## 2011-06-30 MED ORDER — GADOBENATE DIMEGLUMINE 529 MG/ML IV SOLN
15.0000 mL | Freq: Once | INTRAVENOUS | Status: AC
  Administered 2011-06-30: 15 mL via INTRAVENOUS

## 2011-06-30 MED ORDER — SIMVASTATIN 20 MG PO TABS
20.0000 mg | ORAL_TABLET | Freq: Every day | ORAL | Status: DC
  Administered 2011-07-01 – 2011-07-02 (×2): 20 mg via ORAL
  Filled 2011-06-30 (×3): qty 1

## 2011-06-30 MED ORDER — OXYCODONE HCL 5 MG PO TABS: 5.0000 mg | ORAL_TABLET | ORAL | Status: DC | PRN

## 2011-06-30 MED ORDER — ACETAMINOPHEN 325 MG PO TABS: 650.0000 mg | ORAL_TABLET | Freq: Four times a day (QID) | ORAL | Status: DC | PRN

## 2011-06-30 MED ORDER — DEXAMETHASONE SODIUM PHOSPHATE 10 MG/ML IJ SOLN
10.0000 mg | Freq: Once | INTRAMUSCULAR | Status: AC
  Administered 2011-06-30: 10 mg via INTRAVENOUS

## 2011-06-30 MED ORDER — FUROSEMIDE 20 MG PO TABS
20.0000 mg | ORAL_TABLET | Freq: Every day | ORAL | Status: DC | PRN
  Filled 2011-06-30: qty 1

## 2011-06-30 MED ORDER — HYDROCHLOROTHIAZIDE 12.5 MG PO CAPS
12.5000 mg | ORAL_CAPSULE | Freq: Every day | ORAL | Status: DC
  Administered 2011-06-30 – 2011-07-02 (×3): 12.5 mg via ORAL
  Filled 2011-06-30 (×4): qty 1

## 2011-06-30 MED ORDER — LISINOPRIL 10 MG PO TABS
10.0000 mg | ORAL_TABLET | Freq: Every day | ORAL | Status: DC
  Administered 2011-06-30 – 2011-07-02 (×3): 10 mg via ORAL
  Filled 2011-06-30 (×4): qty 1

## 2011-06-30 MED ORDER — ONDANSETRON HCL 4 MG/2ML IJ SOLN: 4.0000 mg | Freq: Four times a day (QID) | INTRAMUSCULAR | Status: DC | PRN

## 2011-06-30 MED ORDER — METOPROLOL TARTRATE 50 MG PO TABS
50.0000 mg | ORAL_TABLET | Freq: Two times a day (BID) | ORAL | Status: DC
  Administered 2011-06-30 – 2011-07-02 (×5): 50 mg via ORAL
  Filled 2011-06-30 (×7): qty 1

## 2011-06-30 MED ORDER — MORPHINE SULFATE 2 MG/ML IJ SOLN: 1.0000 mg | INTRAMUSCULAR | Status: DC | PRN

## 2011-06-30 MED ORDER — ONDANSETRON HCL 4 MG PO TABS: 4.0000 mg | ORAL_TABLET | Freq: Four times a day (QID) | ORAL | Status: DC | PRN

## 2011-06-30 MED ORDER — DEXAMETHASONE SODIUM PHOSPHATE 10 MG/ML IJ SOLN
10.0000 mg | Freq: Four times a day (QID) | INTRAMUSCULAR | Status: DC
  Administered 2011-06-30 – 2011-07-02 (×7): 10 mg via INTRAVENOUS
  Filled 2011-06-30 (×11): qty 1

## 2011-06-30 MED ORDER — DEXAMETHASONE SODIUM PHOSPHATE 10 MG/ML IJ SOLN
10.0000 mg | Freq: Four times a day (QID) | INTRAMUSCULAR | Status: DC
  Filled 2011-06-30: qty 1

## 2011-06-30 MED ORDER — ALBUTEROL SULFATE (5 MG/ML) 0.5% IN NEBU: 2.5000 mg | INHALATION_SOLUTION | Freq: Four times a day (QID) | RESPIRATORY_TRACT | Status: DC | PRN

## 2011-06-30 MED ORDER — LISINOPRIL-HYDROCHLOROTHIAZIDE 10-12.5 MG PO TABS: 1.0000 | ORAL_TABLET | Freq: Every day | ORAL | Status: DC

## 2011-06-30 MED ORDER — ACETAMINOPHEN 650 MG RE SUPP: 650.0000 mg | Freq: Four times a day (QID) | RECTAL | Status: DC | PRN

## 2011-06-30 NOTE — ED Notes (Signed)
Noted patient supine on stretcher with two sons at bedside. Noted NSR on monitor. VSS. Resp are unlabored. Skin warm and dry. Plan of care explained to patient and sons. Pt noted lucid at times but with periods of confusion. SR up. Will continue to monitor.

## 2011-06-30 NOTE — ED Notes (Signed)
Lanny Cramp: [phone] Patient's grandson's contact.

## 2011-06-30 NOTE — ED Notes (Signed)
Pt transported to admission bed stable and in no acute distress.

## 2011-06-30 NOTE — ED Notes (Signed)
Pt resting on stretcher with sons at bedside.  Pt denies any pain, pt is awaiting MRI.

## 2011-06-30 NOTE — H&P (Signed)
PCP:  Lyndon Code, MD, MD  Consultants:  Lucile Shutters, MD, oncology  Chief Complaint:  Fall plus increased confusion  HPI: Patient is a 77 year old white female with past medical history of metastatic melanoma with secondary brain metastases who underwent radiation therapy in November. The patient had been doing moderately well and had been set up with hospice, although according to notes she had not yet established with a hospice nurse who is coming to the house.  The patient was brought in via EMS by her son when it was noted today that she seemed to have some increased confusion and then in the afternoon the patient was found on the floor. After getting the patient up, she noted to be dragging her right leg and having difficulty walking and trouble standing. In the emergency room, the patient's MRI noted increase in size and number of brain metastases with increasing surrounding edema and some mass effect with ventricular displacement as well as some hemorrhage of some lesions. The emergency room discussed this with the patient's oncologist, Dr. Truett Perna who felt it best to observe the patient and he would have an extensive discussion with the patient's family tomorrow as far as further goals of care and the best place for her.  Review of Systems:  I saw the patient in the emergency room, and she really had no complaints. She looks to be confused and there is no family present. The patient herself says she is feeling okay. She denies any headaches, vision changes, dysphasia, chest pain, palpitations, shortness of breath, wheeze, cough, abdominal pain, hematuria, dysuria, codfish, diarrhea, focal extremity numbness weakness or pain. The patient only complaint is that she is fatigued. Her review of systems according to the patient is otherwise negative. She does not recall how she got here.  Past Medical History: Past Medical History  Diagnosis Date  . Cholesterol serum elevated   .  Melanoma 10/16/2010    rt lower leg removed  . Status post radiation therapy within four to twelve weeks 04/01/11    11 small brain mets from melanoma  . History of radiation therapy 12/26/2010    l parietal,r temporal,l cerebellar,r medial temporal   . Brain cancer 12/10/10    dx metastatic melanoma with brain mets  . CAD (coronary artery disease)     s/p MI 2009  . MI (myocardial infarction)   . Hypertension   . Hyperlipidemia   . Hiatal hernia   . Status post tubal ligation     b/l  . S/P angioplasty 2009  . Lower extremity edema     b/l   . Low back pain   . Retroperitoneal hemorrhage 6/212     in right adrenal gland 4.3cm mass   . Anemia     r/t retroperitoneal hemorrhage  . Myeloma    Past Surgical History  Procedure Date  . Abdominal hysterectomy     abd  . Left knee surgery     Medications: Prior to Admission medications   Medication Sig Start Date End Date Taking? Authorizing Provider  aspirin EC 81 MG tablet Take 81 mg by mouth daily.   Yes Historical Provider, MD  furosemide (LASIX) 20 MG tablet Take 20 mg by mouth daily as needed. For swelling   Yes Oneita Hurt, MD  lisinopril-hydrochlorothiazide (PRINZIDE,ZESTORETIC) 10-12.5 MG per tablet Take 1 tablet by mouth daily.     Yes Historical Provider, MD  metoprolol (LOPRESSOR) 50 MG tablet Take 50 mg by mouth 2 (  two) times daily.     Yes Historical Provider, MD  pravastatin (PRAVACHOL) 40 MG tablet Take 40 mg by mouth at bedtime.     Yes Historical Provider, MD  prochlorperazine (COMPAZINE) 10 MG tablet Take 1 tablet (10 mg total) by mouth every 6 (six) hours as needed (nausea). Take this if you experience nausea 06/09/11  Yes Lonna Cobb, NP    Allergies:   Allergies  Allergen Reactions  . Amlodipine Other (See Comments)    Unknown   . Benazepril Other (See Comments)    Unknown   . Carvedilol Other (See Comments)    unknown  . Dicloxacillin Other (See Comments)    unknown  . Sulfur Other (See  Comments)    unknown    Social History:  reports that she has never smoked. She does not have any smokeless tobacco history on file. She reports that she does not drink alcohol or use illicit drugs. The patient is normally at baseline able to participate in some activities of daily living, although it is not immediately clear and the patient cannot clarify. She currently lives at home with 2 sons who check on her nearby..  Family History: The patient says that the rest of her family is fine Physical Exam: Filed Vitals:   06/30/11 1800 06/30/11 1815 06/30/11 1830 06/30/11 2100  BP: 144/88 146/75 151/85   Pulse: 108 111 114   Temp:      TempSrc:      Resp: 22 30 26    Height:    5\' 1"  (1.549 m)  Weight:    74.844 kg (165 lb)  SpO2: 100% 99% 99%    General: Alert and oriented x2. Fatigue. Smokes about stated age. HEENT: Normocephalic, atraumatic, mucous membranes are slightly dry Cardiovascular: Regular rate and rhythm, S1-S2 Lungs:" Bilaterally Abdomen: Soft, obese, nontender, positive bowel sounds Extremity, clubbing or cyanosis, trace pitting edema   Labs on Admission:   [facility] 06/30/11 0828  NA 137  K 3.8  CL 101  CO2 24  GLUCOSE 98  BUN 18  CREATININE 1.01  CALCIUM 9.5  MG --  PHOS --    Basename 06/30/11 0828  AST 19  ALT 7  ALKPHOS 57  BILITOT 0.5  PROT 5.9*  ALBUMIN 3.0*    Basename 06/30/11 0828  WBC 5.8  NEUTROABS 4.1  HGB 8.9*  HCT 28.3*  MCV 73.5*  PLT 155    Radiological Exams on Admission: Ct Head Wo Contrast  06/30/2011   IMPRESSION: Multifocal metastatic disease as seen on prior studies.  No midline shift, hydrocephalus or other acute finding.    Mr Laqueta Jean Wo Contrast  06/30/2011   IMPRESSION: Since the prior examination, there has been an increase in the size and number of multiple intracranial metastatic lesions some of which are hemorrhagic.  There has been increase in surrounding vasogenic edema.  This involves the supratentorial  and infratentorial region.  As index lesion, left parietal lobe lesion now measures up to 2.1 cm versus prior 1.6 cm and development of significant amount of surrounding vasogenic edema causing local mass effect upon the left lateral ventricle which is displaced downward and with mild right- sided midline shift (3 mm).    Assessment/Plan Present on Admission:  .Brain metastasis-metastatic melanoma: She looks to have new and worsening brain metastases. ER and myself tried to contact radiation oncology and unable to. Dr. Truett Perna from medical oncology will see the patient tomorrow. Have started her on IV Decadron. Given MRI note,  patient is an extremely poor prognosis. Have placed a consultation for hospice care for further in her goals of care and look at the possibility of comfort care.  Marland KitchenCAD (coronary artery disease): Stable.  .Weakness of left side of body: May be more effective because of mass effect. See above.  .Fall: Likely related to above.  After discussion with the patient, she is to be a DO NOT RESUSCITATE from previous records.  We will respect these wishes.  I anticipate her length of stay to be anywhere from one day to longer based on disposition plans and where she will be discharged to, unless something should change.  Time spent on this patient including examination and decision-making process: 35 minutes.  Hollice Espy [phone] 06/30/2011, 10:32 PM

## 2011-06-30 NOTE — ED Notes (Signed)
Patient has some neglect to right arm, and an inability to straighten arm completely. Patient reports this is a new occurrence.  Patient also seems slightly confused and is a poor historian. No family at bedside at this time.  Vitals stable, patient resting quietly.

## 2011-06-30 NOTE — ED Notes (Signed)
Pt transported to MRI via stretcher with tech

## 2011-06-30 NOTE — ED Notes (Signed)
Hospitalist at bedside 

## 2011-06-30 NOTE — ED Provider Notes (Cosign Needed)
History     CSN: [id number]  Arrival date & time 06/30/11  0801   First MD Initiated Contact with Patient 06/30/11 [phone]      Chief Complaint  Patient presents with  . Weakness    (Consider location/radiation/quality/duration/timing/severity/associated sxs/prior treatment) HPI patient presents he EMS. They report her son states that it needed when her other son came to eat lunch with her he thought she seemed a little bit confused. Her other son came home from work about 3:30 in the afternoon and found her lying on the floor. He reported after he got her up she seemed to be dragging her right leg and having difficulty walking. Patient relates she's been having trouble standing for a few days. She states she also has decreased appetite. She states she has been following but denies hitting her head. She denies headache, visual disturbance, or chest pain. Of note patient has metastatic melanoma and had radiation for 11 small brain metastases in November.   PCP Dr. Welton Flakes in [facility] Dr. Truett Perna   Past Medical History  Diagnosis Date  . Cholesterol serum elevated   . Melanoma 10/16/2010    rt lower leg removed  . Status post radiation therapy within four to twelve weeks 04/01/11    11 small brain mets from melanoma  . History of radiation therapy 12/26/2010    l parietal,r temporal,l cerebellar,r medial temporal   . Brain cancer 12/10/10    dx metastatic melanoma with brain mets  . CAD (coronary artery disease)     s/p MI 2009  . MI (myocardial infarction)   . Hypertension   . Hyperlipidemia   . Hiatal hernia   . Status post tubal ligation     b/l  . S/P angioplasty 2009  . Lower extremity edema     b/l   . Low back pain   . Retroperitoneal hemorrhage 6/212     in right adrenal gland 4.3cm mass   . Anemia     r/t retroperitoneal hemorrhage  . Myeloma     Past Surgical History  Procedure Date  . Abdominal hysterectomy     abd  . Left knee surgery      History reviewed. No pertinent family history.  History  Substance Use Topics  . Smoking status: Never Smoker   . Smokeless tobacco: Not on file  . Alcohol Use: No   lives at home with her son Use leg walks without assistance  OB History    Grav Para Term Preterm Abortions TAB SAB Ect Mult Living                  Review of Systems  All other systems reviewed and are negative.    Allergies  Amlodipine; Benazepril; Carvedilol; Dicloxacillin; and Sulfur  Home Medications   Current Outpatient Rx  Name Route Sig Dispense Refill  . ASPIRIN 81 MG PO TABS Oral Take 81 mg by mouth daily.      . FUROSEMIDE 20 MG PO TABS Oral Take 20 mg by mouth daily as needed. For swelling    . LISINOPRIL-HYDROCHLOROTHIAZIDE 10-12.5 MG PO TABS Oral Take 1 tablet by mouth daily.      Marland Kitchen METOPROLOL TARTRATE 50 MG PO TABS Oral Take 50 mg by mouth 2 (two) times daily.      Marland Kitchen PRAVASTATIN SODIUM 40 MG PO TABS Oral Take 40 mg by mouth at bedtime.      Marland Kitchen PROCHLORPERAZINE MALEATE 10 MG PO TABS Oral Take 1  tablet (10 mg total) by mouth every 6 (six) hours as needed (nausea). Take this if you experience nausea 30 tablet 1  . TRAMADOL HCL 50 MG PO TABS Oral Take 50 mg by mouth every 6 (six) hours as needed.      BP 154/104  Pulse 103  Resp 16  SpO2 100%  Vital signs normal except for hypertension and tachycardia   Physical Exam  Nursing note and vitals reviewed. Constitutional: She is oriented to person, place, and time.  Non-toxic appearance. She does not appear ill. No distress.       Elderly frail female who is alert  HENT:  Head: Normocephalic and atraumatic.  Right Ear: External ear normal.  Left Ear: External ear normal.  Nose: Nose normal. No mucosal edema or rhinorrhea.  Mouth/Throat: Mucous membranes are normal. No dental abscesses or uvula swelling.       Mucous membranes dry  Eyes: Conjunctivae and EOM are normal. Pupils are equal, round, and reactive to light.  Neck: Normal range  of motion and full passive range of motion without pain. Neck supple.  Cardiovascular: Normal rate, regular rhythm and normal heart sounds.  Exam reveals no gallop and no friction rub.   No murmur heard. Pulmonary/Chest: Effort normal and breath sounds normal. No respiratory distress. She has no wheezes. She has no rhonchi. She has no rales. She exhibits no tenderness and no crepitus.  Abdominal: Soft. Normal appearance and bowel sounds are normal. She exhibits no distension. There is no tenderness. There is no rebound and no guarding.  Musculoskeletal: Normal range of motion. She exhibits no edema and no tenderness.       Patient has no cranial nerve deficit. Patient does have pronator drift of her right upper extremity. She also has difficulty raising her right leg against gravity. Her grip was noticeably weaker on the right compared to left and patient states she is right-handed  Neurological: She is alert and oriented to person, place, and time. She has normal strength. No cranial nerve deficit.  Skin: Skin is warm, dry and intact. No rash noted. No erythema. No pallor.  Psychiatric: She has a normal mood and affect. Her speech is normal and behavior is normal. Her mood appears not anxious.    ED Course  Procedures (including critical care time)  10:00 Dr Thad Ranger given results of CT feels MRI needed.   15:09 Dr Truett Perna states to start on decadron 10 mg q6h, he will consult in am, have medicine admit, let Dr Kathrynn Running know she is in the ED.   Dr Kathrynn Running is off today, no one   16:03 Dr Chancy Milroy, will admit  Results for orders placed during the hospital encounter of 06/30/11  CBC      Component Value Range   WBC 5.8  4.0 - 10.5 (K/uL)   RBC 3.85 (*) 3.87 - 5.11 (MIL/uL)   Hemoglobin 8.9 (*) 12.0 - 15.0 (g/dL)   HCT 96.0 (*) 45.4 - 46.0 (%)   MCV 73.5 (*) 78.0 - 100.0 (fL)   MCH 23.1 (*) 26.0 - 34.0 (pg)   MCHC 31.4  30.0 - 36.0 (g/dL)   RDW 09.8 (*) 11.9 - 15.5 (%)   Platelets  155  150 - 400 (K/uL)  DIFFERENTIAL      Component Value Range   Neutrophils Relative 71  43 - 77 (%)   Neutro Abs 4.1  1.7 - 7.7 (K/uL)   Lymphocytes Relative 21  12 - 46 (%)  Lymphs Abs 1.2  0.7 - 4.0 (K/uL)   Monocytes Relative 8  3 - 12 (%)   Monocytes Absolute 0.4  0.1 - 1.0 (K/uL)   Eosinophils Relative 0  0 - 5 (%)   Eosinophils Absolute 0.0  0.0 - 0.7 (K/uL)   Basophils Relative 0  0 - 1 (%)   Basophils Absolute 0.0  0.0 - 0.1 (K/uL)  COMPREHENSIVE METABOLIC PANEL      Component Value Range   Sodium 137  135 - 145 (mEq/L)   Potassium 3.8  3.5 - 5.1 (mEq/L)   Chloride 101  96 - 112 (mEq/L)   CO2 24  19 - 32 (mEq/L)   Glucose, Bld 98  70 - 99 (mg/dL)   BUN 18  6 - 23 (mg/dL)   Creatinine, Ser 1.61  0.50 - 1.10 (mg/dL)   Calcium 9.5  8.4 - 09.6 (mg/dL)   Total Protein 5.9 (*) 6.0 - 8.3 (g/dL)   Albumin 3.0 (*) 3.5 - 5.2 (g/dL)   AST 19  0 - 37 (U/L)   ALT 7  0 - 35 (U/L)   Alkaline Phosphatase 57  39 - 117 (U/L)   Total Bilirubin 0.5  0.3 - 1.2 (mg/dL)   GFR calc non Af Amer 52 (*) >90 (mL/min)   GFR calc Af Amer 61 (*) >90 (mL/min)  APTT      Component Value Range   aPTT 35  24 - 37 (seconds)  PROTIME-INR      Component Value Range   Prothrombin Time 15.5 (*) 11.6 - 15.2 (seconds)   INR 1.20  0.00 - 1.49    Laboratory interpretation all normal except anemia  Ct Head Wo Contrast  06/30/2011  *RADIOLOGY REPORT*  Clinical Data: The patient found down.  Right side weakness. History of metastatic melanoma.  CT HEAD WITHOUT CONTRAST  Technique:  Contiguous axial images were obtained from the base of the skull through the vertex without contrast.  Comparison: Head CT scan 12/09/2010 and brain MRI 03/19/2011.  Findings: Again seen are multi focal metastatic deposits.  The largest is in the left parietal lobe measuring 1.3 cm in diameter, not markedly changed since the patient's MRI.  There is extensive edema surrounding the dominant parietal metastasis.  No midline shift is  identified.  The patient's metastatic deposits demonstrate increased attenuation as on the comparison CT which is typically due to melanin rather than hemorrhage.  There is no hydrocephalus or midline shift.  No subdural hemorrhage is identified.  IMPRESSION: Multifocal metastatic disease as seen on prior studies.  No midline shift, hydrocephalus or other acute finding.  Original Report Authenticated By: Bernadene Bell. Maricela Curet, M.D.   Mr Laqueta Jean Wo Contrast  06/30/2011  *RADIOLOGY REPORT*  Clinical Data: Right upper extremity and lower extremity weakness. History of melanoma.  MRI HEAD WITHOUT AND WITH CONTRAST  Technique:  Multiplanar, multiecho pulse sequences of the brain and surrounding structures were obtained according to standard protocol without and with intravenous contrast  Contrast: 15mL MULTIHANCE GADOBENATE DIMEGLUMINE 529 MG/ML IV SOLN  Comparison: 06/30/2011 CT.  03/19/2011 MR.  Findings: Since the prior examination, there has been an increase in the size and number of multiple intracranial metastatic lesions some of which are hemorrhagic.  There has been increase in surrounding vasogenic edema.  This involves the supratentorial and infratentorial region.  As index lesion, left parietal lobe lesion now measures up to 2.1 cm versus prior 1.6 cm and development of significant amount of surrounding vasogenic  edema causing local mass effect upon the left lateral ventricle which is displaced downward and with mild right- sided midline shift (3 mm).  Increased vasogenic edema lateral mid to posterior right temporal lobe and medial left temporal lobe with underlying small enhancing lesions.  No acute thrombotic infarct.  Major intracranial vascular structures are patent.  IMPRESSION: Since the prior examination, there has been an increase in the size and number of multiple intracranial metastatic lesions some of which are hemorrhagic.  There has been increase in surrounding vasogenic edema.  This involves  the supratentorial and infratentorial region.  As index lesion, left parietal lobe lesion now measures up to 2.1 cm versus prior 1.6 cm and development of significant amount of surrounding vasogenic edema causing local mass effect upon the left lateral ventricle which is displaced downward and with mild right- sided midline shift (3 mm).  Original Report Authenticated By: Fuller Canada, M.D.     Laboratory interpretation all normal except mild anemia   Date: 06/30/2011  Rate: 97  Rhythm: sinus tachycardia  QRS Axis: normal  Intervals: normal  ST/T Wave abnormalities: normal  Conduction Disutrbances:none  Narrative Interpretation: Q aves septally  Old EKG Reviewed: unchangedfrom 05/12/2010    1. Metastatic cancer to brain   2. Melanoma   3. Weakness of right side of body     Plan admission, will need hospice care  Devoria Albe, MD, FACEP   MDM          Ward Givens, MD 06/30/11 [phone]

## 2011-06-30 NOTE — ED Notes (Signed)
Pt returned from MRI on stretcher with tech and tolerated well.

## 2011-06-30 NOTE — ED Notes (Signed)
Pt appears to be sleeping. Equal rise and fall of chest noted. SR Up. Awaiting ready bed.

## 2011-06-30 NOTE — ED Notes (Signed)
Patient family found patient yesterday around 1500 in the floor, patient limping on right foot. Grips weak on right side per EMS, no facial drooping, denies pain, patient alert and oriented x4

## 2011-07-01 DIAGNOSIS — C7949 Secondary malignant neoplasm of other parts of nervous system: Principal | ICD-10-CM

## 2011-07-01 DIAGNOSIS — C439 Malignant melanoma of skin, unspecified: Secondary | ICD-10-CM

## 2011-07-01 DIAGNOSIS — F29 Unspecified psychosis not due to a substance or known physiological condition: Secondary | ICD-10-CM

## 2011-07-01 DIAGNOSIS — R5381 Other malaise: Secondary | ICD-10-CM

## 2011-07-01 MED ORDER — SODIUM CHLORIDE 0.9 % IV SOLN: INTRAVENOUS | Status: AC

## 2011-07-01 NOTE — Progress Notes (Signed)
Kathleen Vargas CSN:[id number],MRN:[id number] is a 78 y.o. female,  Outpatient Primary MD for the patient is KHAN, Shannan Harper, MD, MD  Chief Complaint  Patient presents with  . Weakness        Subjective:   Kathleen Vargas today has, No headache, No chest pain, No abdominal pain - No Nausea, No new weakness tingling or numbness, No Cough - SOB. Improving Rt sided weakness and numbness  Objective:   Filed Vitals:   06/30/11 2100 06/30/11 2232 07/01/11 0200 07/01/11 0600  BP:  118/71 116/71 120/73  Pulse:  100 88 85  Temp:  99.6 F (37.6 C) 98 F (36.7 C) 97.6 F (36.4 C)  TempSrc:  Oral Oral   Resp:  16 18 16   Height: 5\' 1"  (1.549 m)     Weight: 74.844 kg (165 lb)     SpO2:    96%    Wt Readings from Last 3 Encounters:  06/30/11 74.844 kg (165 lb)  05/28/11 74.844 kg (165 lb)  05/18/11 75.433 kg (166 lb 4.8 oz)     Intake/Output Summary (Last 24 hours) at 07/01/11 1105 Last data filed at 06/30/11 2100  Gross per 24 hour  Intake    240 ml  Output      0 ml  Net    240 ml    Exam Awake Alert, Oriented *3, No new F.N deficits, Rt side 3 to 4 /5 strength poor sensation,  Normal affect [place].AT,PERRAL Supple Neck,No JVD, No cervical lymphadenopathy appriciated.  Symmetrical Chest wall movement, Good air movement bilaterally, CTAB RRR,No Gallops,Rubs or new Murmurs, No Parasternal Heave +ve B.Sounds, Abd Soft, Non tender, No organomegaly appriciated, No rebound -guarding or rigidity. No Cyanosis, Clubbing or edema, No new Rash or bruise    Data Review  CBC  Lab 06/30/11 0828  WBC 5.8  HGB 8.9*  HCT 28.3*  PLT 155  MCV 73.5*  MCH 23.1*  MCHC 31.4  RDW 19.0*  LYMPHSABS 1.2  MONOABS 0.4  EOSABS 0.0  BASOSABS 0.0  BANDABS --    Chemistries   Lab 06/30/11 0828  NA 137  K 3.8  CL 101  CO2 24  GLUCOSE 98  BUN 18  CREATININE 1.01  CALCIUM 9.5  MG --  AST 19  ALT 7  ALKPHOS 57  BILITOT 0.5    ------------------------------------------------------------------------------------------------------------------ estimated creatinine clearance is 43.2 ml/min (by C-G formula based on Cr of 1.01). ------------------------------------------------------------------------------------------------------------------ No results found for this basename: HGBA1C:2 in the last 72 hours ------------------------------------------------------------------------------------------------------------------ No results found for this basename: CHOL:2,HDL:2,LDLCALC:2,TRIG:2,CHOLHDL:2,LDLDIRECT:2 in the last 72 hours ------------------------------------------------------------------------------------------------------------------ No results found for this basename: TSH,T4TOTAL,FREET3,T3FREE,THYROIDAB in the last 72 hours ------------------------------------------------------------------------------------------------------------------ No results found for this basename: VITAMINB12:2,FOLATE:2,FERRITIN:2,TIBC:2,IRON:2,RETICCTPCT:2 in the last 72 hours  Coagulation profile  Lab 06/30/11 0828  INR 1.20  PROTIME --    No results found for this basename: DDIMER:2 in the last 72 hours  Cardiac Enzymes No results found for this basename: CK:3,CKMB:3,TROPONINI:3,MYOGLOBIN:3 in the last 168 hours ------------------------------------------------------------------------------------------------------------------ No components found with this basename: POCBNP:3  Micro Results No results found for this or any previous visit (from the past 240 hour(s)).  Radiology Reports Ct Head Wo Contrast  06/30/2011  *RADIOLOGY REPORT*  Clinical Data: The patient found down.  Right side weakness. History of metastatic melanoma.  CT HEAD WITHOUT CONTRAST  Technique:  Contiguous axial images were obtained from the base of the skull through the vertex without contrast.  Comparison: Head CT scan 12/09/2010 and brain MRI 03/19/2011.   Findings:  Again seen are multi focal metastatic deposits.  The largest is in the left parietal lobe measuring 1.3 cm in diameter, not markedly changed since the patient's MRI.  There is extensive edema surrounding the dominant parietal metastasis.  No midline shift is identified.  The patient's metastatic deposits demonstrate increased attenuation as on the comparison CT which is typically due to melanin rather than hemorrhage.  There is no hydrocephalus or midline shift.  No subdural hemorrhage is identified.  IMPRESSION: Multifocal metastatic disease as seen on prior studies.  No midline shift, hydrocephalus or other acute finding.  Original Report Authenticated By: Bernadene Bell. Maricela Curet, M.D.   Mr Laqueta Jean Wo Contrast  06/30/2011  *RADIOLOGY REPORT*  Clinical Data: Right upper extremity and lower extremity weakness. History of melanoma.  MRI HEAD WITHOUT AND WITH CONTRAST  Technique:  Multiplanar, multiecho pulse sequences of the brain and surrounding structures were obtained according to standard protocol without and with intravenous contrast  Contrast: 15mL MULTIHANCE GADOBENATE DIMEGLUMINE 529 MG/ML IV SOLN  Comparison: 06/30/2011 CT.  03/19/2011 MR.  Findings: Since the prior examination, there has been an increase in the size and number of multiple intracranial metastatic lesions some of which are hemorrhagic.  There has been increase in surrounding vasogenic edema.  This involves the supratentorial and infratentorial region.  As index lesion, left parietal lobe lesion now measures up to 2.1 cm versus prior 1.6 cm and development of significant amount of surrounding vasogenic edema causing local mass effect upon the left lateral ventricle which is displaced downward and with mild right- sided midline shift (3 mm).  Increased vasogenic edema lateral mid to posterior right temporal lobe and medial left temporal lobe with underlying small enhancing lesions.  No acute thrombotic infarct.  Major intracranial  vascular structures are patent.  IMPRESSION: Since the prior examination, there has been an increase in the size and number of multiple intracranial metastatic lesions some of which are hemorrhagic.  There has been increase in surrounding vasogenic edema.  This involves the supratentorial and infratentorial region.  As index lesion, left parietal lobe lesion now measures up to 2.1 cm versus prior 1.6 cm and development of significant amount of surrounding vasogenic edema causing local mass effect upon the left lateral ventricle which is displaced downward and with mild right- sided midline shift (3 mm).  Original Report Authenticated By: Fuller Canada, M.D.    Scheduled Meds:   . dexamethasone  10 mg Intravenous Once  . dexamethasone  10 mg Intravenous Q6H  . gadobenate dimeglumine  15 mL Intravenous Once  . hydrochlorothiazide  12.5 mg Oral Daily  . lisinopril  10 mg Oral Daily  . metoprolol  50 mg Oral BID  . simvastatin  20 mg Oral q1800  . DISCONTD: dexamethasone  10 mg Intravenous Q6H  . DISCONTD: lisinopril-hydrochlorothiazide  1 tablet Oral Daily   Continuous Infusions:   . sodium chloride 1,000 mL (07/01/11 1001)   PRN Meds:.acetaminophen, acetaminophen, albuterol, furosemide, morphine, ondansetron (ZOFRAN) IV, ondansetron, oxyCODONE  Assessment & Plan   . Rt sided weakness and Fall due to Brain metastasis - metastatic melanoma: She looks to have new and worsening brain metastases per MRI. Seen by  Dr. Truett Perna from medical oncology today note awaited. Continue on IV Decadron. Given the clinical picture patient is an extremely poor prognosis. Pt wants consultation for hospice care for further in her goals of care and look at the possibility of comfort care. ? Placement.     Marland KitchenCAD (coronary artery  disease): Stable , CPC.   DVT Prophylaxis    SCDs  See all Orders from today for further details     Leroy Sea M.D on 07/01/2011 at 11:05 AM  [facility] Office  [phone]

## 2011-07-01 NOTE — Progress Notes (Signed)
07/01/2011 Kathleen Vargas SPARKS Case Management Note [phone]  Utilization review completed.  

## 2011-07-01 NOTE — Progress Notes (Signed)
Palliative Medicine Team consult for goals of care, hospice options requested by Dr Rito Ehrlich; and also notified by Darl Pikes Dr Kalman Drape office nurse, that Dr Truett Perna is agreeable to discussion and assessment for [facility]/residential hospice eligibility. Spoke with patient and son Hessie Diener at bedside meeting scheduled for tomorrow Thursday, 07/02/11 @ 2:00 pm (during Alan's lunch from work).   Valente David, RN 07/01/2011, 3:10 PM Palliative Medicine Team RN Liaison [phone]

## 2011-07-01 NOTE — Progress Notes (Signed)
IP PROGRESS NOTE  Subjective:   She presented to the emergency room yesterday with right-sided weakness and confusion. An MRI of the brain confirmed progression of the brain metastases.  She denies pain. She reports difficulty walking and right-sided weakness.  Objective: Vital signs in last 24 hours: Blood pressure 120/73, pulse 85, temperature 97.6 F (36.4 C), temperature source Oral, resp. rate 16, height 5\' 1"  (1.549 m), weight 165 lb (74.844 kg), SpO2 96.00%.  Intake/Output from previous day: 02/26 0701 - 02/27 0700 In: 240 [P.O.:240] Out: -   Physical Exam:  HEENT: No thrush. Lungs: Decreased breath sounds at the left greater than right base with and inspiratory rails. No respiratory distress. Cardiac: Regular rate and rhythm Abdomen: No hepatomegaly, no mass Extremities: No edema Neurologic: Alert. She appears to have some right-sided neglect. Mild weakness of the right arm and leg. She had difficulty with finger to nose testing. Lymph nodes: There is a left posterior cervical node and a large nodal mass in the right groin Skin: Multiple pigmented cutaneous nodules over the trunk  Lab Results:  [facility] 06/30/11 0828  WBC 5.8  HGB 8.9*  HCT 28.3*  PLT 155    BMET  Basename 06/30/11 0828  NA 137  K 3.8  CL 101  CO2 24  GLUCOSE 98  BUN 18  CREATININE 1.01  CALCIUM 9.5    Studies/Results: Ct Head Wo Contrast  06/30/2011  *RADIOLOGY REPORT*  Clinical Data: The patient found down.  Right side weakness. History of metastatic melanoma.  CT HEAD WITHOUT CONTRAST  Technique:  Contiguous axial images were obtained from the base of the skull through the vertex without contrast.  Comparison: Head CT scan 12/09/2010 and brain MRI 03/19/2011.  Findings: Again seen are multi focal metastatic deposits.  The largest is in the left parietal lobe measuring 1.3 cm in diameter, not markedly changed since the patient's MRI.  There is extensive edema surrounding the dominant  parietal metastasis.  No midline shift is identified.  The patient's metastatic deposits demonstrate increased attenuation as on the comparison CT which is typically due to melanin rather than hemorrhage.  There is no hydrocephalus or midline shift.  No subdural hemorrhage is identified.  IMPRESSION: Multifocal metastatic disease as seen on prior studies.  No midline shift, hydrocephalus or other acute finding.  Original Report Authenticated By: Bernadene Bell. Maricela Curet, M.D.   Mr Laqueta Jean Wo Contrast  06/30/2011  *RADIOLOGY REPORT*  Clinical Data: Right upper extremity and lower extremity weakness. History of melanoma.  MRI HEAD WITHOUT AND WITH CONTRAST  Technique:  Multiplanar, multiecho pulse sequences of the brain and surrounding structures were obtained according to standard protocol without and with intravenous contrast  Contrast: 15mL MULTIHANCE GADOBENATE DIMEGLUMINE 529 MG/ML IV SOLN  Comparison: 06/30/2011 CT.  03/19/2011 MR.  Findings: Since the prior examination, there has been an increase in the size and number of multiple intracranial metastatic lesions some of which are hemorrhagic.  There has been increase in surrounding vasogenic edema.  This involves the supratentorial and infratentorial region.  As index lesion, left parietal lobe lesion now measures up to 2.1 cm versus prior 1.6 cm and development of significant amount of surrounding vasogenic edema causing local mass effect upon the left lateral ventricle which is displaced downward and with mild right- sided midline shift (3 mm).  Increased vasogenic edema lateral mid to posterior right temporal lobe and medial left temporal lobe with underlying small enhancing lesions.  No acute thrombotic infarct.  Major intracranial  vascular structures are patent.  IMPRESSION: Since the prior examination, there has been an increase in the size and number of multiple intracranial metastatic lesions some of which are hemorrhagic.  There has been increase in  surrounding vasogenic edema.  This involves the supratentorial and infratentorial region.  As index lesion, left parietal lobe lesion now measures up to 2.1 cm versus prior 1.6 cm and development of significant amount of surrounding vasogenic edema causing local mass effect upon the left lateral ventricle which is displaced downward and with mild right- sided midline shift (3 mm).  Original Report Authenticated By: Fuller Canada, M.D.    Medications: I have reviewed the patient's current medications.  Assessment/Plan: 1. Metastatic melanoma, status post resection of a right calf superficial spreading Clark level 4, Breslow measurement 4.25 mm melanoma with ulceration and a positive margin (T4b) on 01/31/2010. This was followed by a re-excision on 07/01/2009 with no residual melanoma and negative surgical margins. 2. Retroperitoneal hemorrhage felt to arise in the right adrenal gland with a 4.3 cm mass noted in the right adrenal gland in June 2012. a. Abdominal ultrasound 01/18/2011 confirmed an increase in the size of the right adrenal mass. 3. Pain secondary to the retroperitoneal hemorrhage, resolved. 4. Acute abdominal pain 01/18/2011, ? related to the right adrenal mass, resolved. 5. Status post biopsy of a right inguinal lymph node 10/29/2010, nondiagnostic. 6. PET scan 10/30/2010 with a hypermetabolic 10 mm nodule within the subcutaneous fat of the high right back, hypermetabolic superior aspect of the right adrenal gland, nodular linear uptake within the left and right lower pelvis just superior to the vaginal cuff, and a hypermetabolic right inguinal lymph node. 7. Status post biopsy of a right upper back subcutaneous nodule 11/13/2010 with the pathology confirming melanoma, BRAF wild type. 8. Right lower extremity edema with a venous Doppler negative for a DVT 10/18/2010. A repeat Doppler of the right lower extremity 11/11/2010 was negative for a DVT or superficial thrombosis. 9. History of  Anemia, likely related to the retroperitoneal hemorrhage. 10. Basal cell carcinoma of the left upper chest 01/31/2010, status post an excision 07/01/2010 with no residual basal cell carcinoma and negative margins. 11. History of a myocardial infarction in 2009, angioplasty in 2009. 12. Brain metastases noted on a staging CT of the brain 12/09/2010, status post stereotactic radiosurgery on 12/26/2010. -Repeat stereotactic radiosurgery for treatment of multiple brain lesions on 04/01/2011. 13. Right hemiplegia secondary to progression of brain metastases with a dominant left parietal lesion with significant edema. 14. 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 She presents with symptomatic progression of the brain metastases. I doubt she will benefit from further brain radiation. Decadron may improve her symptoms for a short time. The tumor is be BRAF wild-type. She has declined a trial of systemic chemotherapy in the past. Her prognosis is poor.  Recommendations: 1. Continue Decadron, switched to by mouth Decadron if she is able to take by mouth. I would start at 8 mg twice daily.  2. [facility] hospice referral for home care or [facility].  I will follow her while hospitalized and I will be glad to serve as the primary hospice physician at discharge. Nazli Penn BRADLEY  07/01/2011, 2:03 PM

## 2011-07-01 NOTE — Evaluation (Signed)
Physical Therapy Evaluation Patient Details Name: Kathleen Vargas MRN: [id number] DOB: Oct 05, 1931 Today's Date: 07/01/2011  Problem List:  Patient Active Problem List  Diagnoses  . Brain metastasis  . Status post radiation therapy within four to twelve weeks  . CAD (coronary artery disease)  . MI (myocardial infarction)  . Hyperlipidemia  . Lower extremity edema  . Low back pain  . Retroperitoneal hemorrhage  . Weakness of left side of body  . Fall    Past Medical History:  Past Medical History  Diagnosis Date  . Cholesterol serum elevated   . Melanoma 10/16/2010    rt lower leg removed  . Status post radiation therapy within four to twelve weeks 04/01/11    11 small brain mets from melanoma  . History of radiation therapy 12/26/2010    l parietal,r temporal,l cerebellar,r medial temporal   . Brain cancer 12/10/10    dx metastatic melanoma with brain mets  . CAD (coronary artery disease)     s/p MI 2009  . MI (myocardial infarction)   . Hypertension   . Hyperlipidemia   . Hiatal hernia   . Status post tubal ligation     b/l  . S/P angioplasty 2009  . Lower extremity edema     b/l   . Low back pain   . Retroperitoneal hemorrhage 6/212     in right adrenal gland 4.3cm mass   . Anemia     r/t retroperitoneal hemorrhage  . Myeloma    Past Surgical History:  Past Surgical History  Procedure Date  . Abdominal hysterectomy     abd  . Left knee surgery     PT Assessment/Plan/Recommendation PT Assessment Clinical Impression Statement: Patient presents with recent decline in functional mobility and falls. MRI demonstrates increase in brain mets. I recommend PT in the acute care setting to enable patient to improve her functional mobility to enable her at minimum to ambulate with the walker to and from the bathroom, so she can potentially return home at a later time. Currently patient is limited predominately by decreased proprioception and inability to grade the degree of  weight shift to her right in standing to prevent falls with gait. PT Recommendation/Assessment: Patient will need skilled PT in the acute care venue PT Problem List: Decreased strength;Decreased activity tolerance;Decreased balance;Decreased mobility;Decreased cognition;Impaired sensation PT Therapy Diagnosis : Difficulty walking;Abnormality of gait;Generalized weakness PT Plan PT Frequency: Min 3X/week PT Treatment/Interventions: DME instruction;Gait training;Functional mobility training;Therapeutic activities;Therapeutic exercise;Balance training;Patient/family education;Cognitive remediation;Neuromuscular re-education PT Recommendation Follow Up Recommendations: Skilled nursing facility Equipment Recommended: Defer to next venue PT Goals  Acute Rehab PT Goals PT Goal Formulation: With patient Time For Goal Achievement: 2 weeks Pt will Roll Supine to Right Side: with modified independence PT Goal: Rolling Supine to Right Side - Progress: Goal set today Pt will Roll Supine to Left Side: with modified independence PT Goal: Rolling Supine to Left Side - Progress: Goal set today Pt will go Supine/Side to Sit: with modified independence PT Goal: Supine/Side to Sit - Progress: Goal set today Pt will go Sit to Supine/Side: with modified independence PT Goal: Sit to Supine/Side - Progress: Goal set today Pt will go Sit to Stand: with supervision;with upper extremity assist PT Goal: Sit to Stand - Progress: Goal set today Pt will go Stand to Sit: with supervision;with upper extremity assist PT Goal: Stand to Sit - Progress: Goal set today Pt will Transfer Bed to Chair/Chair to Bed: with supervision PT Transfer Goal: Bed to  Chair/Chair to Bed - Progress: Goal set today Pt will Stand: with supervision;with no upper extremity support;1 - 2 min PT Goal: Stand - Progress: Goal set today Pt will Ambulate: 51 - 150 feet;with supervision;with least restrictive assistive device PT Goal: Ambulate -  Progress: Goal set today  PT Evaluation Precautions/Restrictions  Precautions Precautions: Fall Prior Functioning  Home Living Lives With: Sheran Spine Help From: Family Type of Home: House Home Layout: One level Home Access: Ramped entrance Bathroom Shower/Tub: Engineer, manufacturing systems: Standard Home Adaptive Equipment: Environmental consultant - standard Prior Function Level of Independence: Independent with basic ADLs;Needs assistance with homemaking;Independent with transfers;Independent with gait (for heavier housework activities) Driving: No Vocation: Retired 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 Arousal/Alertness: Awake/alert Overall Cognitive Status: Impaired Orientation Level: Oriented X4 Following Commands: Follows multi-step commands inconsistently Problem Solving: Requires assistance for problem solving Sensation/Coordination Sensation Light Touch: Appears Intact Proprioception: Impaired by gross assessment Coordination Gross Motor Movements are Fluid and Coordinated: Yes Extremity Assessment RLE Assessment RLE Assessment: Exceptions to [facility] RLE AROM (degrees) Overall AROM Right Lower Extremity: Within functional limits for tasks assessed RLE Strength RLE Overall Strength: Deficits RLE Overall Strength Comments: Grossly 4/5. LLE Assessment LLE Assessment: Within Functional Limits Mobility (including Balance) Bed Mobility Bed Mobility: Yes Rolling Left: 5: Supervision;With rail Rolling Left Details (indicate cue type and reason): Verbal cues for lower body rotation Left Sidelying to Sit: 5: Supervision;With rails;HOB elevated (comment degrees) Left Sidelying to Sit Details (indicate cue type and reason): 20 degrees - tactile cues to bring leg to side of bed.  Sitting - Scoot to Edge of Bed: 5: Supervision Sitting - Scoot to [employer] of Bed Details (indicate cue type and reason): Verbal cues as patient sitting with right side retracted and feet uneven on  floor Transfers Transfers: Yes Sit to Stand: 4: Min assist;With upper extremity assist;From bed;From chair/3-in-1 Sit to Stand Details (indicate cue type and reason): Verbal cues for safe hand placement to and from rolling walker. Facilitation for anterior weight shift and to stabilize once standing. Needs assistance for right upper extremity elevation to walker hand rest. Stand to Sit: To chair/3-in-1;To bed;4: Min assist;With upper extremity assist Stand to Sit Details: To control descent and tactile cues for use of hands on arm rests. Stand Pivot Transfers: 3: Mod assist Stand Pivot Transfer Details (indicate cue type and reason): with rolling walker. Facilitation as patient with difficulty grading weight shift to right leg resulting in overshooting and right lean. Worked with patient in standing without walker on grading of weight shift and flexing left hip to improve step clearance of left leg due to improved stability in stance on right leg. Patient with signficant fear of falling.  Posture/Postural Control Posture/Postural Control: Postural limitations Postural Limitations: Increased postural sway in quasi-static standing. Increased throacic kyphosis Balance Balance Assessed: Yes Static Sitting Balance Static Sitting - Balance Support: No upper extremity supported;Feet supported Static Sitting - Level of Assistance: 5: Stand by assistance Static Standing Balance Static Standing - Balance Support: No upper extremity supported Static Standing - Level of Assistance: 4: Min assist End of Session PT - End of Session Equipment Utilized During Treatment: Gait belt Activity Tolerance: Patient tolerated treatment well Patient left: in chair;with call bell in reach;with family/visitor present Nurse Communication: Mobility status for transfers General Behavior During Session: [facility] for tasks performed Cognition: Impaired  Edwyna Perfect, PT  Pager [phone]  07/01/2011, 12:26 PM

## 2011-07-01 NOTE — Progress Notes (Signed)
Speech Language/Pathology SLP Cancellation Note  Evaluation deferred today due to patient has passed the RN stroke swallow screen and tolerating diet well per RN. Defer formal eval per protocol. Please reconsult as needed. Thank you.  Ferdinand Lango MA, CCC-SLP [phone]   Ferdinand Lango Meryl 07/01/2011, 9:57 AM

## 2011-07-02 ENCOUNTER — Telehealth: Payer: Self-pay | Admitting: Oncology

## 2011-07-02 ENCOUNTER — Ambulatory Visit
Admit: 2011-07-02 | Discharge: 2011-07-02 | Disposition: A | Discharge status: Home / Self Care | Payer: Medicare Other | Attending: Radiation Oncology | Admitting: Radiation Oncology

## 2011-07-02 DIAGNOSIS — C7931 Secondary malignant neoplasm of brain: Secondary | ICD-10-CM

## 2011-07-02 MED ORDER — DEXAMETHASONE 4 MG PO TABS
8.0000 mg | ORAL_TABLET | Freq: Two times a day (BID) | ORAL | Status: DC
  Administered 2011-07-02 – 2011-07-03 (×3): 8 mg via ORAL
  Filled 2011-07-02 (×5): qty 2

## 2011-07-02 NOTE — Progress Notes (Signed)
Kathleen Vargas CSN:[id number],MRN:[id number] is a 78 y.o. female,  Outpatient Primary MD for the patient is KHAN, Shannan Harper, MD, MD  Chief Complaint  Patient presents with  . Weakness        Subjective:   Kathleen Vargas today has, No headache, No chest pain, No abdominal pain - No Nausea, No new weakness tingling or numbness, No Cough - SOB. Improving Rt sided weakness and numbness  Objective:   Filed Vitals:   07/02/11 0600 07/02/11 0837 07/02/11 1005 07/02/11 1148  BP: 147/86 135/82 151/93 155/88  Pulse: 78 100 90 82  Temp: 97.5 F (36.4 C) 97.9 F (36.6 C) 98 F (36.7 C)   TempSrc: Oral Oral Oral   Resp: 19 18 18    Height:      Weight:      SpO2: 96% 97% 98%     Wt Readings from Last 3 Encounters:  06/30/11 74.844 kg (165 lb)  05/28/11 74.844 kg (165 lb)  05/18/11 75.433 kg (166 lb 4.8 oz)     Intake/Output Summary (Last 24 hours) at 07/02/11 1327 Last data filed at 07/02/11 0830  Gross per 24 hour  Intake    480 ml  Output      0 ml  Net    480 ml    Exam Awake Alert, Oriented *3, No new F.N deficits, Rt side 3 to 4 /5 strength poor sensation,  Normal affect [place].AT,PERRAL Supple Neck,No JVD, No cervical lymphadenopathy appriciated.  Symmetrical Chest wall movement, Good air movement bilaterally, CTAB RRR,No Gallops,Rubs or new Murmurs, No Parasternal Heave +ve B.Sounds, Abd Soft, Non tender, No organomegaly appriciated, No rebound -guarding or rigidity. No Cyanosis, Clubbing or edema, No new Rash or bruise    Data Review  CBC  Lab 06/30/11 0828  WBC 5.8  HGB 8.9*  HCT 28.3*  PLT 155  MCV 73.5*  MCH 23.1*  MCHC 31.4  RDW 19.0*  LYMPHSABS 1.2  MONOABS 0.4  EOSABS 0.0  BASOSABS 0.0  BANDABS --    Chemistries   Lab 06/30/11 0828  NA 137  K 3.8  CL 101  CO2 24  GLUCOSE 98  BUN 18  CREATININE 1.01  CALCIUM 9.5  MG --  AST 19  ALT 7  ALKPHOS 57  BILITOT 0.5    ------------------------------------------------------------------------------------------------------------------ estimated creatinine clearance is 43.2 ml/min (by C-G formula based on Cr of 1.01). ------------------------------------------------------------------------------------------------------------------ No results found for this basename: HGBA1C:2 in the last 72 hours ------------------------------------------------------------------------------------------------------------------ No results found for this basename: CHOL:2,HDL:2,LDLCALC:2,TRIG:2,CHOLHDL:2,LDLDIRECT:2 in the last 72 hours ------------------------------------------------------------------------------------------------------------------ No results found for this basename: TSH,T4TOTAL,FREET3,T3FREE,THYROIDAB in the last 72 hours ------------------------------------------------------------------------------------------------------------------ No results found for this basename: VITAMINB12:2,FOLATE:2,FERRITIN:2,TIBC:2,IRON:2,RETICCTPCT:2 in the last 72 hours  Coagulation profile  Lab 06/30/11 0828  INR 1.20  PROTIME --    No results found for this basename: DDIMER:2 in the last 72 hours  Cardiac Enzymes No results found for this basename: CK:3,CKMB:3,TROPONINI:3,MYOGLOBIN:3 in the last 168 hours ------------------------------------------------------------------------------------------------------------------ No components found with this basename: POCBNP:3  Micro Results No results found for this or any previous visit (from the past 240 hour(s)).  Radiology Reports Ct Head Wo Contrast  06/30/2011  *RADIOLOGY REPORT*  Clinical Data: The patient found down.  Right side weakness. History of metastatic melanoma.  CT HEAD WITHOUT CONTRAST  Technique:  Contiguous axial images were obtained from the base of the skull through the vertex without contrast.  Comparison: Head CT scan 12/09/2010 and brain MRI 03/19/2011.   Findings: Again seen are multi focal metastatic  deposits.  The largest is in the left parietal lobe measuring 1.3 cm in diameter, not markedly changed since the patient's MRI.  There is extensive edema surrounding the dominant parietal metastasis.  No midline shift is identified.  The patient's metastatic deposits demonstrate increased attenuation as on the comparison CT which is typically due to melanin rather than hemorrhage.  There is no hydrocephalus or midline shift.  No subdural hemorrhage is identified.  IMPRESSION: Multifocal metastatic disease as seen on prior studies.  No midline shift, hydrocephalus or other acute finding.  Original Report Authenticated By: Bernadene Bell. Maricela Curet, M.D.   Mr Laqueta Jean Wo Contrast  06/30/2011  *RADIOLOGY REPORT*  Clinical Data: Right upper extremity and lower extremity weakness. History of melanoma.  MRI HEAD WITHOUT AND WITH CONTRAST  Technique:  Multiplanar, multiecho pulse sequences of the brain and surrounding structures were obtained according to standard protocol without and with intravenous contrast  Contrast: 15mL MULTIHANCE GADOBENATE DIMEGLUMINE 529 MG/ML IV SOLN  Comparison: 06/30/2011 CT.  03/19/2011 MR.  Findings: Since the prior examination, there has been an increase in the size and number of multiple intracranial metastatic lesions some of which are hemorrhagic.  There has been increase in surrounding vasogenic edema.  This involves the supratentorial and infratentorial region.  As index lesion, left parietal lobe lesion now measures up to 2.1 cm versus prior 1.6 cm and development of significant amount of surrounding vasogenic edema causing local mass effect upon the left lateral ventricle which is displaced downward and with mild right- sided midline shift (3 mm).  Increased vasogenic edema lateral mid to posterior right temporal lobe and medial left temporal lobe with underlying small enhancing lesions.  No acute thrombotic infarct.  Major intracranial  vascular structures are patent.  IMPRESSION: Since the prior examination, there has been an increase in the size and number of multiple intracranial metastatic lesions some of which are hemorrhagic.  There has been increase in surrounding vasogenic edema.  This involves the supratentorial and infratentorial region.  As index lesion, left parietal lobe lesion now measures up to 2.1 cm versus prior 1.6 cm and development of significant amount of surrounding vasogenic edema causing local mass effect upon the left lateral ventricle which is displaced downward and with mild right- sided midline shift (3 mm).  Original Report Authenticated By: Fuller Canada, M.D.    Scheduled Meds:    . dexamethasone  8 mg Oral BID  . hydrochlorothiazide  12.5 mg Oral Daily  . lisinopril  10 mg Oral Daily  . metoprolol  50 mg Oral BID  . simvastatin  20 mg Oral q1800  . DISCONTD: dexamethasone  10 mg Intravenous Q6H   Continuous Infusions:    . sodium chloride 880 mL (07/01/11 1112)   PRN Meds:.acetaminophen, acetaminophen, albuterol, furosemide, morphine, ondansetron (ZOFRAN) IV, ondansetron, oxyCODONE  Assessment & Plan   . Rt sided weakness and Fall due to Brain metastasis - metastatic melanoma: She looks to have new and worsening brain metastases per MRI. Seen by  Dr. Truett Perna from medical oncology today note awaited. Continue  Decadron changed to PO. Given the clinical picture patient is an extremely poor prognosis. Pt wants consultation for hospice care for further in her goals of care and look at the possibility of comfort care to be done today. ? Placement.     Marland KitchenCAD (coronary artery disease): Stable , CPC.   DVT Prophylaxis    SCDs  See all Orders from today for further details  Leroy Sea M.D on 07/02/2011 at 1:27 PM  [facility] Office  [phone]

## 2011-07-02 NOTE — Progress Notes (Signed)
IP PROGRESS NOTE  Subjective:   She reports improvement in the right-sided weakness. She and her son plan to meet with the palliative care/hospice team today.  Objective: Vital signs in last 24 hours: Blood pressure 147/86, pulse 78, temperature 97.5 F (36.4 C), temperature source Oral, resp. rate 19, height 5\' 1"  (1.549 m), weight 165 lb (74.844 kg), SpO2 96.00%.  Intake/Output from previous day: 02/27 0701 - 02/28 0700 In: 2911.7 [P.O.:240; I.V.:2671.7] Out: -   Physical Exam:   Neurologic: Alert. Improved right arm and leg weakness.  Lab Results:  [facility] 06/30/11 0828  WBC 5.8  HGB 8.9*  HCT 28.3*  PLT 155    BMET  Basename 06/30/11 0828  NA 137  K 3.8  CL 101  CO2 24  GLUCOSE 98  BUN 18  CREATININE 1.01  CALCIUM 9.5    Studies/Results: Ct Head Wo Contrast  06/30/2011  *RADIOLOGY REPORT*  Clinical Data: The patient found down.  Right side weakness. History of metastatic melanoma.  CT HEAD WITHOUT CONTRAST  Technique:  Contiguous axial images were obtained from the base of the skull through the vertex without contrast.  Comparison: Head CT scan 12/09/2010 and brain MRI 03/19/2011.  Findings: Again seen are multi focal metastatic deposits.  The largest is in the left parietal lobe measuring 1.3 cm in diameter, not markedly changed since the patient's MRI.  There is extensive edema surrounding the dominant parietal metastasis.  No midline shift is identified.  The patient's metastatic deposits demonstrate increased attenuation as on the comparison CT which is typically due to melanin rather than hemorrhage.  There is no hydrocephalus or midline shift.  No subdural hemorrhage is identified.  IMPRESSION: Multifocal metastatic disease as seen on prior studies.  No midline shift, hydrocephalus or other acute finding.  Original Report Authenticated By: Bernadene Bell. Maricela Curet, M.D.   Mr Laqueta Jean Wo Contrast  06/30/2011  *RADIOLOGY REPORT*  Clinical Data: Right upper extremity  and lower extremity weakness. History of melanoma.  MRI HEAD WITHOUT AND WITH CONTRAST  Technique:  Multiplanar, multiecho pulse sequences of the brain and surrounding structures were obtained according to standard protocol without and with intravenous contrast  Contrast: 15mL MULTIHANCE GADOBENATE DIMEGLUMINE 529 MG/ML IV SOLN  Comparison: 06/30/2011 CT.  03/19/2011 MR.  Findings: Since the prior examination, there has been an increase in the size and number of multiple intracranial metastatic lesions some of which are hemorrhagic.  There has been increase in surrounding vasogenic edema.  This involves the supratentorial and infratentorial region.  As index lesion, left parietal lobe lesion now measures up to 2.1 cm versus prior 1.6 cm and development of significant amount of surrounding vasogenic edema causing local mass effect upon the left lateral ventricle which is displaced downward and with mild right- sided midline shift (3 mm).  Increased vasogenic edema lateral mid to posterior right temporal lobe and medial left temporal lobe with underlying small enhancing lesions.  No acute thrombotic infarct.  Major intracranial vascular structures are patent.  IMPRESSION: Since the prior examination, there has been an increase in the size and number of multiple intracranial metastatic lesions some of which are hemorrhagic.  There has been increase in surrounding vasogenic edema.  This involves the supratentorial and infratentorial region.  As index lesion, left parietal lobe lesion now measures up to 2.1 cm versus prior 1.6 cm and development of significant amount of surrounding vasogenic edema causing local mass effect upon the left lateral ventricle which is displaced downward and with  mild right- sided midline shift (3 mm).  Original Report Authenticated By: Fuller Canada, M.D.    Medications: I have reviewed the patient's current medications.  Assessment/Plan: 1. Metastatic melanoma, status post resection  of a right calf superficial spreading Clark level 4, Breslow measurement 4.25 mm melanoma with ulceration and a positive margin (T4b) on 01/31/2010. This was followed by a re-excision on 07/01/2009 with no residual melanoma and negative surgical margins. 2. Retroperitoneal hemorrhage felt to arise in the right adrenal gland with a 4.3 cm mass noted in the right adrenal gland in June 2012. a. Abdominal ultrasound 01/18/2011 confirmed an increase in the size of the right adrenal mass. 3. Pain secondary to the retroperitoneal hemorrhage, resolved. 4. Acute abdominal pain 01/18/2011, ? related to the right adrenal mass, resolved. 5. Status post biopsy of a right inguinal lymph node 10/29/2010, nondiagnostic. 6. PET scan 10/30/2010 with a hypermetabolic 10 mm nodule within the subcutaneous fat of the high right back, hypermetabolic superior aspect of the right adrenal gland, nodular linear uptake within the left and right lower pelvis just superior to the vaginal cuff, and a hypermetabolic right inguinal lymph node. 7. Status post biopsy of a right upper back subcutaneous nodule 11/13/2010 with the pathology confirming melanoma, BRAF wild type. 8. Right lower extremity edema with a venous Doppler negative for a DVT 10/18/2010. A repeat Doppler of the right lower extremity 11/11/2010 was negative for a DVT or superficial thrombosis. 9. History of Anemia, likely related to the retroperitoneal hemorrhage. 10. Basal cell carcinoma of the left upper chest 01/31/2010, status post an excision 07/01/2010 with no residual basal cell carcinoma and negative margins. 11. History of a myocardial infarction in 2009, angioplasty in 2009. 12. Brain metastases noted on a staging CT of the brain 12/09/2010, status post stereotactic radiosurgery on 12/26/2010. -Repeat stereotactic radiosurgery for treatment of multiple brain lesions on 04/01/2011. 13. Right hemiplegia secondary to progression of brain metastases with a  dominant left parietal lesion with significant edema. There appears to be improvement with Decadron. 14. 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 Her overall prognosis remains poor. She and her son will benefit from a [facility] hospice referral. She appears to be a good candidate for [place] [facility].  Recommendations: 1. Continue Decadron, switched to by mouth Decadron starting today.  2. [facility] hospice referral for home care or [facility]. I think she will need placement.  I will check on her in the early a.m. 07/03/2011. I will then be out until 07/10/2011. Please call oncology as needed. I will see her at [facility] if she is transferred there. Jakita Dutkiewicz BRADLEY  07/02/2011, 8:24 AM

## 2011-07-02 NOTE — Progress Notes (Signed)
Met with patient and oldest son Kathleen Vargas re: goals of care.  Primary problems: Metastatic Melanoma Failure to Thrive Weakness and immobility Right Dominant Hemiparesis due to Brain Mets Depression  Summary: 1. DNR 2. Appropriate candidate for and agreeable to [facility]- prognosis a few weeks given decline, worsening of brain mets and cancer progression 3. Patient and son's biggest concern is that she they have legal/financial affairs to "get in order", son needs POA, he is worried that once she is at [facility] they will not be able to take care of these things, I reassured him [name] could assist them with resources regarding this important aspect of EOL planning. May actually be a short window, but patient at this point has capacity and is mentally alert. 4. Patient accepting of prognosis and diagnosis, handling her personal grief related to her diagnosis/prognosis well, struggling more with grief regarding daughter that died and son that is in nursing home. 5. She has no physical complaints except immobility/lack of full function on her right side, also needs full assist OOB and with repositioning in bed.  Spent 45 minutes at bedside discussing diagnosis, prognosis and treatment options related to hospice and palliative care.

## 2011-07-02 NOTE — Telephone Encounter (Signed)
Per Dr. Truett Perna cancelled pts appt for 03/12.  MD stated that pt is in hospice

## 2011-07-02 NOTE — Progress Notes (Signed)
Clinical Social Worker received referral for new SNF placement. Clinical Social Worker reviewed chart and noted plan for Palliative Medicine Team Goals of Care meeting with pt and pt son today at 2 pm. Clinical Social Worker to follow up after recommendations are made from Palliative Medicine Goals of Care Meeting. Clinical Social Worker to continue to follow and assist with discharge planning.   Jacklynn Lewis, MSW, LCSWA  Clinical Social Work [phone]

## 2011-07-02 NOTE — Progress Notes (Signed)
Clinical Social Worker completed psychosocial assessment and placed in shadow chart. Clinical Child psychotherapist met with pt, pt son, and pt granddaughter at bedside to discuss residential hospice placement. Clinical Child psychotherapist offered residential hospice choice and pt chooses [facility]. Clinical Social Worker clarified pt and pt son questions in regard to power of attorney and provided Advanced Directives packet. Dicussed that [facility] [facility] could assist them with resources regarding power of attorney as well. Clinical Social Worker contacted Forrestine Him, [facility] liaison to make referral. Per Forrestine Him, Dr. Truett Perna had also made referral and [facility] has a bed available for pt tomorrow. Clinical Social Worker notified MD. Per Forrestine Him, [facility] requesting pt be transferred to [facility] as early as possible and preferably by 10 am. Clinical Social Worker notified MD. Clinical Social Worker to facilitate pt discharge needs in the morning.   Jacklynn Lewis, MSW, LCSWA  Clinical Social Work [phone]

## 2011-07-03 ENCOUNTER — Institutional Professional Consult (permissible substitution): Payer: PRIVATE HEALTH INSURANCE | Admitting: Radiation Oncology

## 2011-07-03 MED ORDER — DEXAMETHASONE 4 MG PO TABS: 8.0000 mg | ORAL_TABLET | Freq: Two times a day (BID) | ORAL | Status: AC

## 2011-07-03 MED ORDER — PANTOPRAZOLE SODIUM 40 MG PO TBEC: 40.0000 mg | DELAYED_RELEASE_TABLET | Freq: Every day | ORAL | Status: AC

## 2011-07-03 MED ORDER — OXYCODONE HCL 5 MG PO TABS: 5.0000 mg | ORAL_TABLET | ORAL | Status: AC | PRN

## 2011-07-03 NOTE — Discharge Summary (Signed)
Kathleen Vargas, 78 y.o., DOB June 03, 1931, MRN [id number]. Admission date: 06/30/2011 Discharge Date 07/03/2011 Primary MD Lyndon Code, MD, MD Admitting Physician Hollice Espy, MD  Admission Diagnosis  Melanoma [172.9] Metastatic cancer to brain [198.3, 199.1] Weakness of right side of body [728.87] stroke sx  Discharge Diagnosis   Principal Problem:  *Brain metastasis Active Problems:  CAD (coronary artery disease)  Weakness of left side of body  Fall    Past Medical History  Diagnosis Date  . Cholesterol serum elevated   . Melanoma 10/16/2010    rt lower leg removed  . Status post radiation therapy within four to twelve weeks 04/01/11    11 small brain mets from melanoma  . History of radiation therapy 12/26/2010    l parietal,r temporal,l cerebellar,r medial temporal   . Brain cancer 12/10/10    dx metastatic melanoma with brain mets  . CAD (coronary artery disease)     s/p MI 2009  . MI (myocardial infarction)   . Hypertension   . Hyperlipidemia   . Hiatal hernia   . Status post tubal ligation     b/l  . S/P angioplasty 2009  . Lower extremity edema     b/l   . Low back pain   . Retroperitoneal hemorrhage 6/212     in right adrenal gland 4.3cm mass   . Anemia     r/t retroperitoneal hemorrhage  . Myeloma     Past Surgical History  Procedure Date  . Abdominal hysterectomy     abd  . Left knee surgery      Hospital Course See H&P, Labs, Consult and Test reports for all details in brief, patient had H/O She had H/O Metastatic melanoma, status post resection of a right calf superficial spreading Clark level 4, Breslow measurement 4.25 mm melanoma with ulceration and a positive margin (T4b) on 01/31/2010. This was followed by a re-excision on 07/01/2009 with no residual melanoma and negative surgical margins.   She now admitted for Rt sided weakness and Fall due to Brain metastasis - metastatic melanoma: She looks to have new and worsening brain metastases per  MRI. Seen by Dr. Truett Perna from medical oncology who recommended to Continue Decadron PO. Given the clinical picture patient is an extremely poor prognosis. It was decided that patient would be best served with Hospice/Comfort care. Patient and family OK with the plan.   Consults  Oncology, Pall Care  Significant Tests:  See full reports for all details     Ct Head Wo Contrast  06/30/2011  *RADIOLOGY REPORT*  Clinical Data: The patient found down.  Right side weakness. History of metastatic melanoma.  CT HEAD WITHOUT CONTRAST  Technique:  Contiguous axial images were obtained from the base of the skull through the vertex without contrast.  Comparison: Head CT scan 12/09/2010 and brain MRI 03/19/2011.  Findings: Again seen are multi focal metastatic deposits.  The largest is in the left parietal lobe measuring 1.3 cm in diameter, not markedly changed since the patient's MRI.  There is extensive edema surrounding the dominant parietal metastasis.  No midline shift is identified.  The patient's metastatic deposits demonstrate increased attenuation as on the comparison CT which is typically due to melanin rather than hemorrhage.  There is no hydrocephalus or midline shift.  No subdural hemorrhage is identified.  IMPRESSION: Multifocal metastatic disease as seen on prior studies.  No midline shift, hydrocephalus or other acute finding.  Original Report Authenticated By: Bernadene Bell. Maricela Curet, M.D.  Mr Laqueta Jean Wo Contrast  06/30/2011  *RADIOLOGY REPORT*  Clinical Data: Right upper extremity and lower extremity weakness. History of melanoma.  MRI HEAD WITHOUT AND WITH CONTRAST  Technique:  Multiplanar, multiecho pulse sequences of the brain and surrounding structures were obtained according to standard protocol without and with intravenous contrast  Contrast: 15mL MULTIHANCE GADOBENATE DIMEGLUMINE 529 MG/ML IV SOLN  Comparison: 06/30/2011 CT.  03/19/2011 MR.  Findings: Since the prior examination, there has been  an increase in the size and number of multiple intracranial metastatic lesions some of which are hemorrhagic.  There has been increase in surrounding vasogenic edema.  This involves the supratentorial and infratentorial region.  As index lesion, left parietal lobe lesion now measures up to 2.1 cm versus prior 1.6 cm and development of significant amount of surrounding vasogenic edema causing local mass effect upon the left lateral ventricle which is displaced downward and with mild right- sided midline shift (3 mm).  Increased vasogenic edema lateral mid to posterior right temporal lobe and medial left temporal lobe with underlying small enhancing lesions.  No acute thrombotic infarct.  Major intracranial vascular structures are patent.  IMPRESSION: Since the prior examination, there has been an increase in the size and number of multiple intracranial metastatic lesions some of which are hemorrhagic.  There has been increase in surrounding vasogenic edema.  This involves the supratentorial and infratentorial region.  As index lesion, left parietal lobe lesion now measures up to 2.1 cm versus prior 1.6 cm and development of significant amount of surrounding vasogenic edema causing local mass effect upon the left lateral ventricle which is displaced downward and with mild right- sided midline shift (3 mm).  Original Report Authenticated By: Fuller Canada, M.D.     Today   Subjective:   Kathleen Vargas today has no headache,no chest abdominal pain,no new weakness tingling or numbness, feels much better.     Objective:   Blood pressure 144/82, pulse 81, temperature 98.2 F (36.8 C), temperature source Oral, resp. rate 20, height 5\' 1"  (1.549 m), weight 74.844 kg (165 lb), SpO2 98.00%.  Intake/Output Summary (Last 24 hours) at 07/03/11 0714 Last data filed at 07/02/11 0830  Gross per 24 hour  Intake    240 ml  Output      0 ml  Net    240 ml    Exam Awake Alert, Oriented *3, No new F.N  deficits, Normal affect, improving Rt sided weakness [place].AT,PERRAL Supple Neck,No JVD, No cervical lymphadenopathy appriciated.  Symmetrical Chest wall movement, Good air movement bilaterally, CTAB RRR,No Gallops,Rubs or new Murmurs, No Parasternal Heave +ve B.Sounds, Abd Soft, Non tender, No organomegaly appriciated, No rebound -guarding or rigidity. No Cyanosis, Clubbing or edema, No new Rash or bruise  Data Review      CBC w Diff: Lab Results  Component Value Date   WBC 5.8 06/30/2011   WBC 4.6 12/09/2010   HGB 8.9* 06/30/2011   HGB 12.0 12/09/2010   HCT 28.3* 06/30/2011   HCT 35.1 12/09/2010   PLT 155 06/30/2011   PLT 192 12/09/2010   LYMPHOPCT 21 06/30/2011   LYMPHOPCT 32.8 12/09/2010   MONOPCT 8 06/30/2011   MONOPCT 7.4 12/09/2010   EOSPCT 0 06/30/2011   EOSPCT 3.3 12/09/2010   BASOPCT 0 06/30/2011   BASOPCT 0.4 12/09/2010   CMP: Lab Results  Component Value Date   NA 137 06/30/2011   NA 141 12/09/2010   K 3.8 06/30/2011   K 4.8* 12/09/2010  CL 101 06/30/2011   CL 99 12/09/2010   CO2 24 06/30/2011   CO2 29 12/09/2010   BUN 18 06/30/2011   BUN 21 12/09/2010   CREATININE 1.01 06/30/2011   CREATININE 1.24* 03/17/2011   PROT 5.9* 06/30/2011   PROT 6.8 12/09/2010   ALBUMIN 3.0* 06/30/2011   BILITOT 0.5 06/30/2011   BILITOT 0.60 12/09/2010   ALKPHOS 57 06/30/2011   ALKPHOS 80 12/09/2010   AST 19 06/30/2011   AST 26 12/09/2010   ALT 7 06/30/2011  .  Micro Results No results found for this or any previous visit (from the past 240 hour(s)).   Discharge Instructions      Follow with Primary MD and Dr Truett Perna as desired.  Follow with Primary MD Lyndon Code, MD, MD in 7 days   Get Medicines reviewed and adjusted.  Please request your Prim.MD to go over all Hospital Tests and Procedure/Radiological results at the follow up, please get all Hospital records sent to your Prim MD by signing hospital release before you go home.  Follow-up Information    Follow up with Lyndon Code, MD in 1 week.    Contact information:   [address] [place] [address] [phone]       Follow up with Lucile Shutters, MD. Schedule an appointment as soon as possible for a visit in 3 days.   Contact information:   [address] [place] [address] [phone]          Activity: Fall precautions use walker/cane & assistance as needed  Diet:  Regular, Aspiration precautions.  For Heart failure patients - Check your Weight same time everyday, if you gain over 2 pounds, or you develop in leg swelling, experience more shortness of breath or chest pain, call your Primary MD immediately. Follow Cardiac Low Salt Diet and 1.8 lit/day fluid restriction.  Disposition Home  If you experience worsening of your admission symptoms, develop shortness of breath, life threatening emergency, suicidal or homicidal thoughts you must seek medical attention immediately by calling 911 or calling your MD immediately  if symptoms less severe.  You Must read complete instructions/literature along with all the possible adverse reactions/side effects for all the Medicines you take and that have been prescribed to you. Take any new Medicines after you have completely understood and accpet all the possible adverse reactions/side effects.   Do not drive if your were admitted for syncope or siezures until you have seen by Primary MD or a Neurologist and advised to drive.  Do not drive when taking Pain medications.    Do not take more than prescribed Pain, Sleep and Anxiety Medications  Special Instructions: If you have smoked or chewed Tobacco  in the last 2 yrs please stop smoking, stop any regular Alcohol  and or any Recreational drug use.  Wear Seat belts while driving.  Follow-up Information    Follow up with Lyndon Code, MD in 1 week.   Contact information:   [address] [place] [address] [phone]       Follow up with Lucile Shutters,  MD. Schedule an appointment as soon as possible for a visit in 3 days.   Contact information:   [address] [place] [address] [phone]          Discharge Medications   Medication List  As of 07/03/2011  7:14 AM   START taking these medications  dexamethasone 4 MG tablet   Commonly known as: DECADRON   Take 2 tablets (8 mg total) by mouth 2 (two) times daily.      oxyCODONE 5 MG immediate release tablet   Commonly known as: Oxy IR/ROXICODONE   Take 1 tablet (5 mg total) by mouth every 4 (four) hours as needed.      pantoprazole 40 MG tablet   Commonly known as: PROTONIX   Take 1 tablet (40 mg total) by mouth daily.         CONTINUE taking these medications         furosemide 20 MG tablet   Commonly known as: LASIX      lisinopril-hydrochlorothiazide 10-12.5 MG per tablet   Commonly known as: PRINZIDE,ZESTORETIC      metoprolol 50 MG tablet   Commonly known as: LOPRESSOR      pravastatin 40 MG tablet   Commonly known as: PRAVACHOL      prochlorperazine 10 MG tablet   Commonly known as: COMPAZINE   Take 1 tablet (10 mg total) by mouth every 6 (six) hours as needed (nausea). Take this if you experience nausea         STOP taking these medications         aspirin EC 81 MG tablet          Where to get your medications    These are the prescriptions that you need to pick up. We sent them to a specific pharmacy, so you will need to go there to get them.   [id number] - [place] ([place]), [place] - [address]    [address] [place] ([place]) [place] [address]    Phone: [phone]        pantoprazole 40 MG tablet         You may get these medications from any pharmacy.         dexamethasone 4 MG tablet   oxyCODONE 5 MG immediate release tablet             Total Time in preparing paper work, data evaluation and todays exam - 35 minutes  Leroy Sea M.D on 07/03/2011 at 7:14 AM  [facility] Office   [phone]

## 2011-07-03 NOTE — Progress Notes (Signed)
[facility] Liaison: DC summary has been faxed to [facility]. Forrestine Him LCSW [phone]

## 2011-07-03 NOTE — Progress Notes (Signed)
Clinical Social Worker facilitated pt discharge needs including contacting facility, family, and arranging ambulance transportation to [facility]. No further social work needs at this time. Clinical Social Worker signing off.  Jacklynn Lewis, MSW, LCSWA  Clinical Social Work [phone]

## 2011-07-03 NOTE — Discharge Instructions (Signed)
Follow with Primary MD and Dr Truett Perna as desired.  Follow with Primary MD Lyndon Code, MD, MD in 7 days   Get Medicines reviewed and adjusted.  Please request your Prim.MD to go over all Hospital Tests and Procedure/Radiological results at the follow up, please get all Hospital records sent to your Prim MD by signing hospital release before you go home.  Follow-up Information    Follow up with Lyndon Code, MD in 1 week.   Contact information:   [address] [place] [address] [phone]       Follow up with Lucile Shutters, MD. Schedule an appointment as soon as possible for a visit in 3 days.   Contact information:   [address] [place] [address] [phone]          Activity: Fall precautions use walker/cane & assistance as needed  Diet:  Regular, Aspiration precautions.  For Heart failure patients - Check your Weight same time everyday, if you gain over 2 pounds, or you develop in leg swelling, experience more shortness of breath or chest pain, call your Primary MD immediately. Follow Cardiac Low Salt Diet and 1.8 lit/day fluid restriction.  Disposition Home  If you experience worsening of your admission symptoms, develop shortness of breath, life threatening emergency, suicidal or homicidal thoughts you must seek medical attention immediately by calling 911 or calling your MD immediately  if symptoms less severe.  You Must read complete instructions/literature along with all the possible adverse reactions/side effects for all the Medicines you take and that have been prescribed to you. Take any new Medicines after you have completely understood and accpet all the possible adverse reactions/side effects.   Do not drive if your were admitted for syncope or siezures until you have seen by Primary MD or a Neurologist and advised to drive.  Do not drive when taking Pain medications.    Do not take more than prescribed  Pain, Sleep and Anxiety Medications  Special Instructions: If you have smoked or chewed Tobacco  in the last 2 yrs please stop smoking, stop any regular Alcohol  and or any Recreational drug use.  Wear Seat belts while driving.

## 2011-07-03 NOTE — Progress Notes (Signed)
Attempted to see pt. In pm; however, she reports she is fatigued.   Explained to her that therapy interventions are her choice.  She did request OT return if possible.  Will re-attempt.  Jeani Hawking, OTR/L [phone]

## 2011-07-03 NOTE — Progress Notes (Signed)
Radiation Oncology         [phone]) [phone] ________________________________  Name: Kathleen Vargas MRN: [id number]  Date: 07/03/2011  DOB: [birth year]       Follow-Up Visit Note  CC: Lyndon Code, MD, MD  No ref. provider found  Diagnosis:   78 year old woman with recurrent brain metastases from metastatic melanoma  Interval Since Last Radiation:  3 months  Narrative . . . . . . . . The patient received stereotactic radiosurgery to 4 brain metastases in August of 2012. She required salvage stereotactic radiosurgery to 11 new lesions in late November of 2012.  Following salvage stereotactic radiosurgery, patient refused survey lungs imaging followup citing a desire to pursue a less aggressive approach to surveillance and intervention. She was recently admitted to the hospital with increasing right upper extremity weakness. MRI now shows progressive brain metastases. Her new symptoms may be the result of an acute hemorrhage involving a left parietal brain metastasis with significant surrounding vasogenic edema. Following the initiation of steroids, her symptoms have improved. Dr. Truett Perna asked that I speak with her regarding possible treatment options in this setting versus comfort care.                         Physical Findings. . .  height is 5\' 1"  (1.549 m) and weight is 165 lb (74.844 kg). Her oral temperature is 98.2 F (36.8 C). Her blood pressure is 144/82 and her pulse is 81. Her respiration is 20 and oxygen saturation is 98%. .  No significant changes. 4/5 motor strength in the right upper extremity  Mr Laqueta Jean Wo Contrast  06/30/2011  *RADIOLOGY REPORT*  Clinical Data: Right upper extremity and lower extremity weakness. History of melanoma.  MRI HEAD WITHOUT AND WITH CONTRAST  Technique:  Multiplanar, multiecho pulse sequences of the brain and surrounding structures were obtained according to standard protocol without and with intravenous contrast  Contrast: 15mL MULTIHANCE GADOBENATE  DIMEGLUMINE 529 MG/ML IV SOLN  Comparison: 06/30/2011 CT.  03/19/2011 MR.  Findings: Since the prior examination, there has been an increase in the size and number of multiple intracranial metastatic lesions some of which are hemorrhagic.  There has been increase in surrounding vasogenic edema.  This involves the supratentorial and infratentorial region.  As index lesion, left parietal lobe lesion now measures up to 2.1 cm versus prior 1.6 cm and development of significant amount of surrounding vasogenic edema causing local mass effect upon the left lateral ventricle which is displaced downward and with mild right- sided midline shift (3 mm).  Increased vasogenic edema lateral mid to posterior right temporal lobe and medial left temporal lobe with underlying small enhancing lesions.  No acute thrombotic infarct.  Major intracranial vascular structures are patent.  IMPRESSION: Since the prior examination, there has been an increase in the size and number of multiple intracranial metastatic lesions some of which are hemorrhagic.  There has been increase in surrounding vasogenic edema.  This involves the supratentorial and infratentorial region.  As index lesion, left parietal lobe lesion now measures up to 2.1 cm versus prior 1.6 cm and development of significant amount of surrounding vasogenic edema causing local mass effect upon the left lateral ventricle which is displaced downward and with mild right- sided midline shift (3 mm).  Original Report Authenticated By: Fuller Canada, M.D.    Impression . . . . . . . The patient has progressive brain metastases  Plan . . . . . . . . . . . Marland Kitchen  today, I talked with the patient and her son about the recent findings. We discussed the possibility for salvage whole brain radiotherapy. I do think this could potentially benefit her in terms of shrinking visible metastases in helping to reduce her risk for new brain metastases. In doing so, this would likely extend her  survival and help maintain quality of life. However, the risks of whole brain radiotherapy would include hair loss, fatigue, and possible neurocognitive changes including decreased short-term memory. In discussing the pros and cons of treatment at this time, the patient does not wish to pursue radiation treatment. She would prefer to pursue hospice care and is agreeable to transfer to [facility] place during the next few days. I will be more than happy to see her if she has additional questions related radiation therapy or desires a change in the current plan.  _____________________________________  Artist Pais. Kathrynn Running, M.D.

## 2011-07-06 ENCOUNTER — Institutional Professional Consult (permissible substitution): Payer: PRIVATE HEALTH INSURANCE | Admitting: Radiation Oncology

## 2011-07-08 ENCOUNTER — Encounter: Payer: Self-pay | Admitting: *Deleted

## 2011-07-08 DIAGNOSIS — C7931 Secondary malignant neoplasm of brain: Secondary | ICD-10-CM

## 2011-07-13 ENCOUNTER — Ambulatory Visit: Payer: PRIVATE HEALTH INSURANCE | Admitting: Radiation Oncology

## 2011-07-14 ENCOUNTER — Ambulatory Visit: Payer: PRIVATE HEALTH INSURANCE | Admitting: Oncology

## 2011-07-16 ENCOUNTER — Ambulatory Visit: Payer: Medicare Other | Admitting: Radiation Oncology

## 2011-07-24 ENCOUNTER — Telehealth: Payer: Self-pay | Admitting: *Deleted

## 2011-07-24 NOTE — Telephone Encounter (Signed)
Message from [place] reporting pt is feeling weaker, sleeping more and coordination seems to be decreasing. R side seems weaker. Just wanted to make MD aware.  MD notified. Dr. Truett Perna plans to see pt next week at [facility].

## 2011-08-14 ENCOUNTER — Telehealth: Payer: Self-pay | Admitting: *Deleted

## 2011-08-14 NOTE — Telephone Encounter (Signed)
Message from Garth Bigness with an update on pt's status: Despite increasing Decadron, pt is less mobile and cognitively seems "less sharp" than she had been. Just wanted to make MD aware.

## 2011-08-17 ENCOUNTER — Telehealth: Payer: Self-pay | Admitting: *Deleted

## 2011-08-17 NOTE — Telephone Encounter (Signed)
Son, Hetty Ely wishes to speak with Dr. Truett Perna.

## 2011-08-25 ENCOUNTER — Ambulatory Visit: Payer: Medicare Other | Admitting: Oncology

## 2011-08-25 DIAGNOSIS — C439 Malignant melanoma of skin, unspecified: Secondary | ICD-10-CM

## 2011-08-27 ENCOUNTER — Telehealth: Payer: Self-pay | Admitting: *Deleted

## 2011-08-27 NOTE — Telephone Encounter (Signed)
DR.HERTWECK ORDERED ATIVAN 4MG  RECTALLY. VERBAL ORDER AND READ BACK TO DR.SHERRILL-DR.HERTWECK TO MANAGE PT.'S CURRENT SEIZURE EPISODE. CONTINUE TO UPDATE WITH PT.'S CONDITION. NOTIFIED HEATHER. SHE VOICES UNDERSTANDING.

## 2011-08-28 NOTE — Progress Notes (Signed)
Patient seen at [facility].  See note in [facility] chart.

## 2011-10-03 DEATH — deceased

## 2011-11-09 IMAGING — CT CT HEAD WO/W CM
1 of 2 series · 13 of 30 positions shown, 17 images · IV contrast (APPLIED)
Comparison: None of the head.

CLINICAL DATA: Recently diagnosed melanoma with confusion and right
hand numbness/weakness.

CT HEAD WITHOUT AND WITH CONTRAST
TECHNIQUE: Contiguous axial images were obtained from the base of
the skull through the vertex without and with intravenous contrast.
Contrast: 100 ml Bmnipaque-A11 intravenously.

[Series 2: head w/o 4.8 h45s · axial · non-contrast · 0.43mm/px · z∈[-150,-36]mm · 13 of 30 slices shown, 17 images]
[im 3/30  brain]
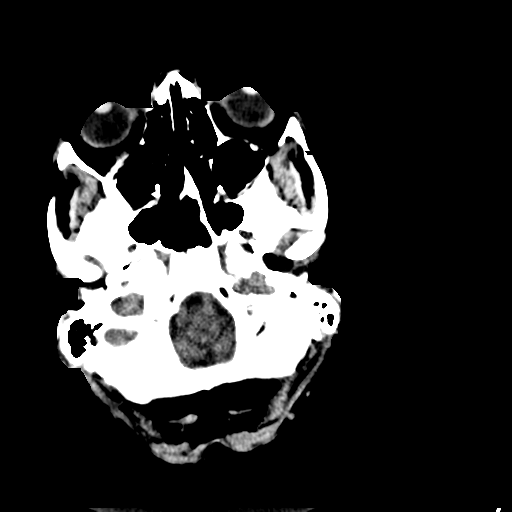
[im 3/30  bone]
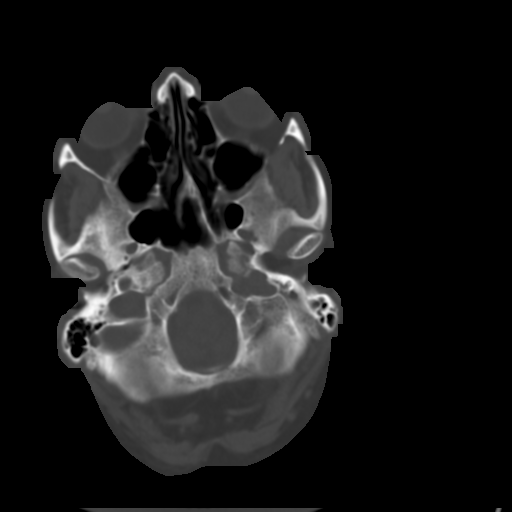
[im 5/30  brain]
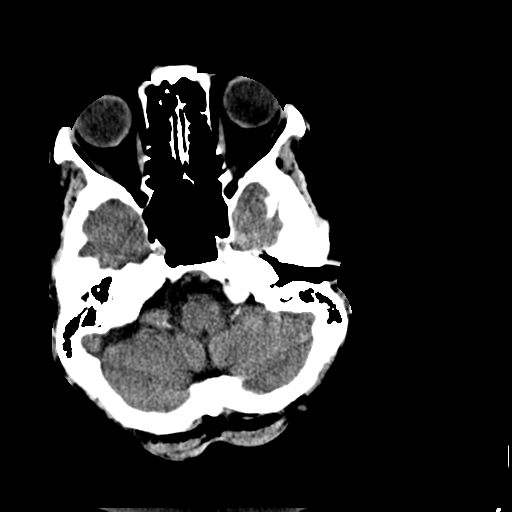
[im 7/30  brain]
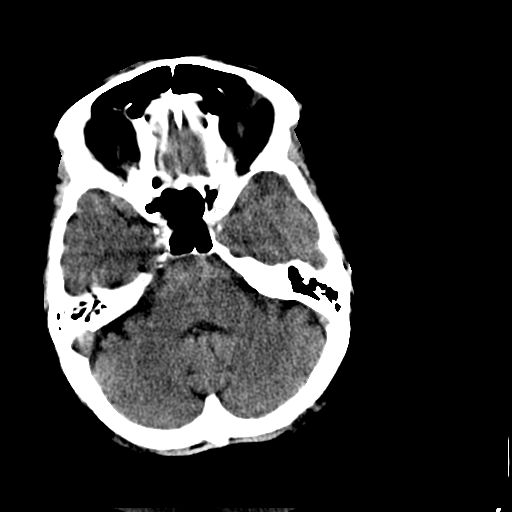
[im 9/30  brain]
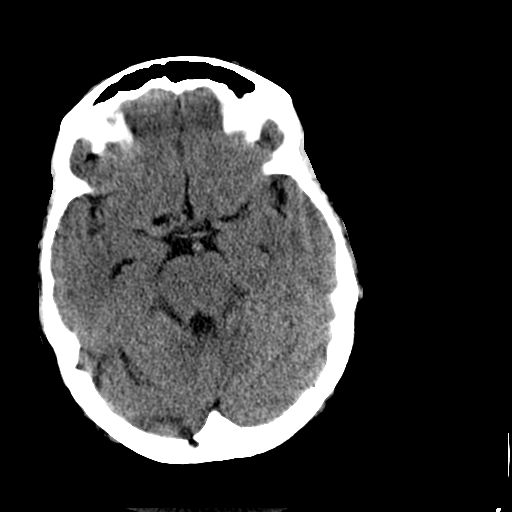
[im 11/30  brain]
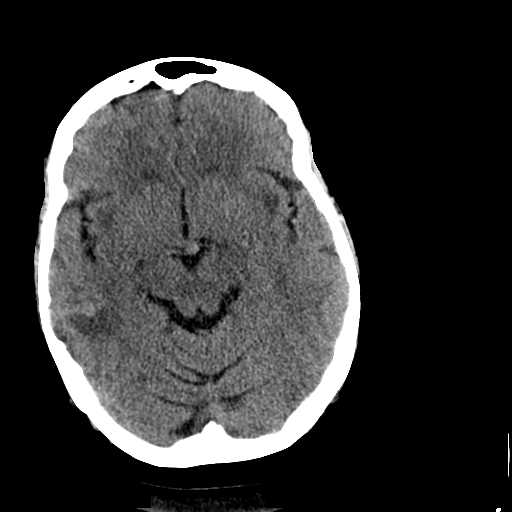
[im 11/30  bone]
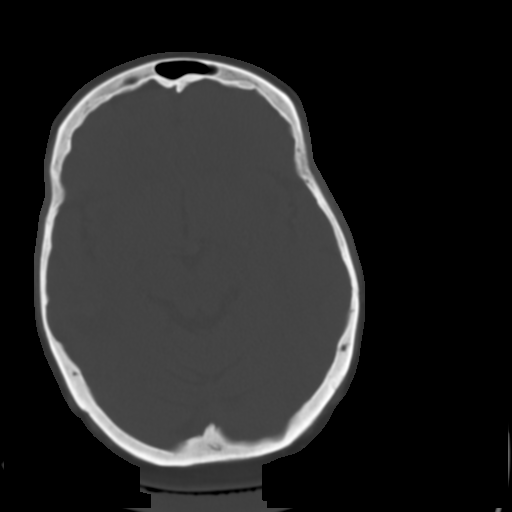
[im 13/30  brain]
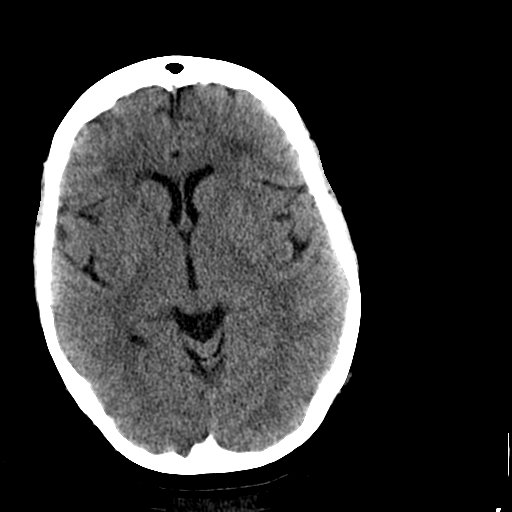
[im 15/30  brain]
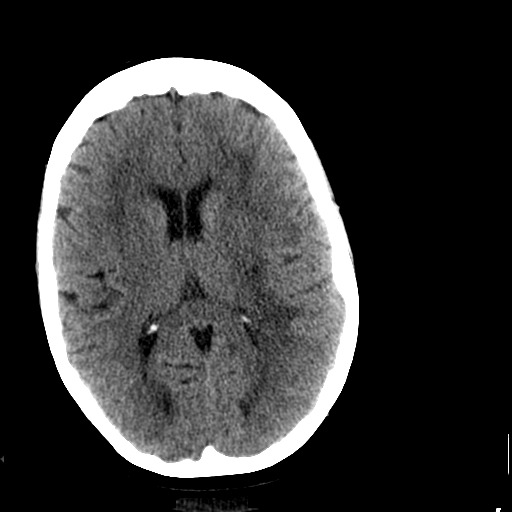
[im 17/30  brain]
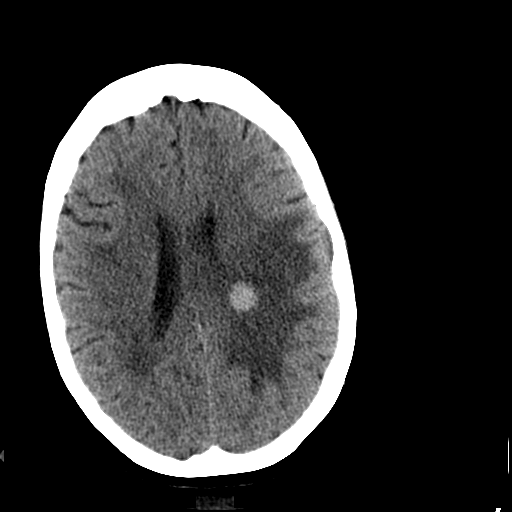
[im 19/30  brain]
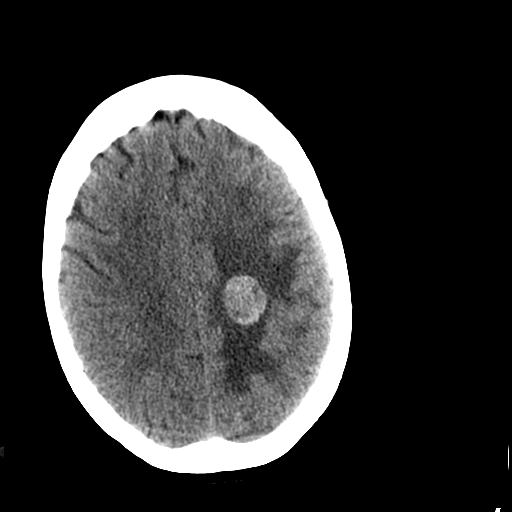
[im 19/30  bone]
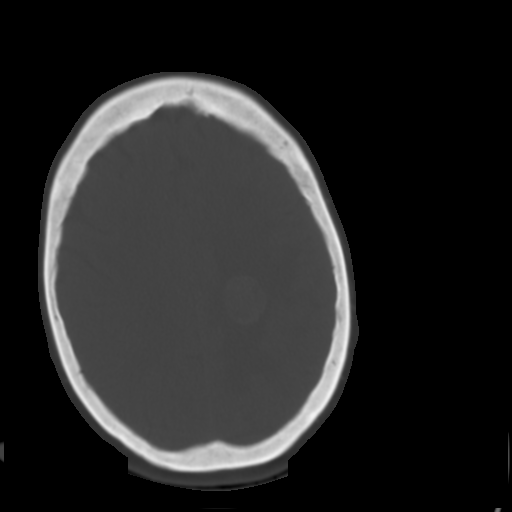
[im 21/30  brain]
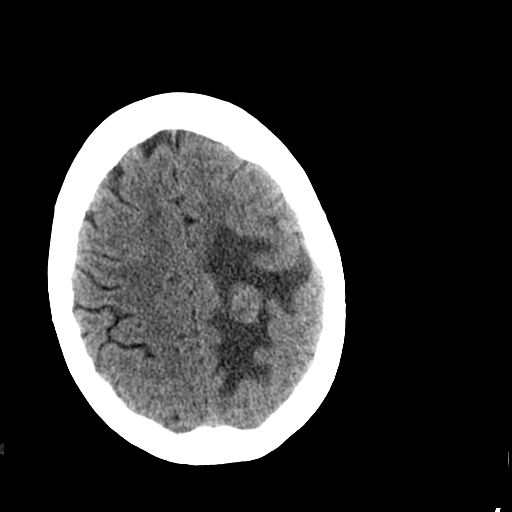
[im 23/30  brain]
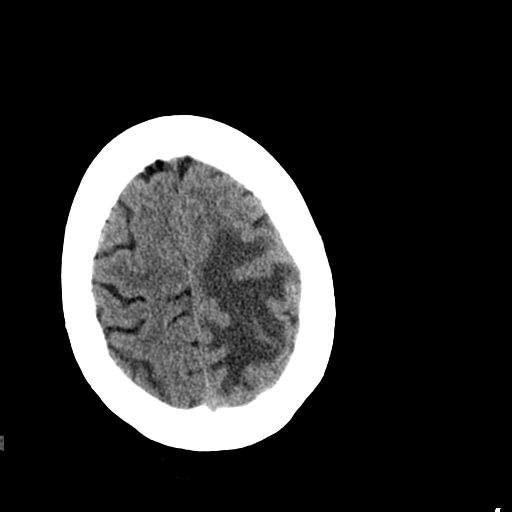
[im 25/30  brain]
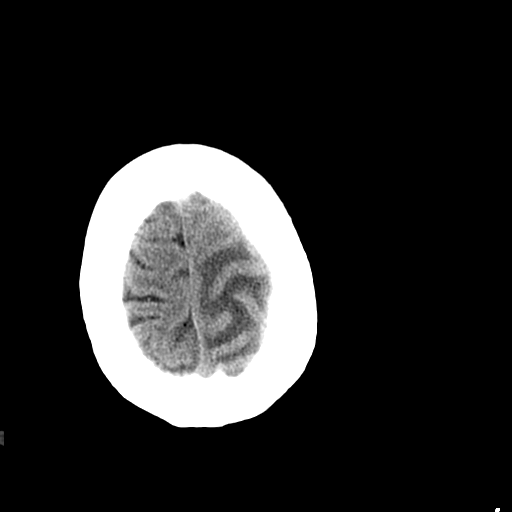
[im 27/30  brain]
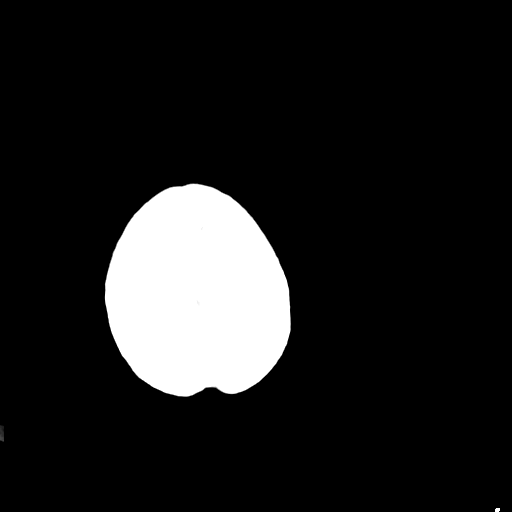
[im 27/30  bone]
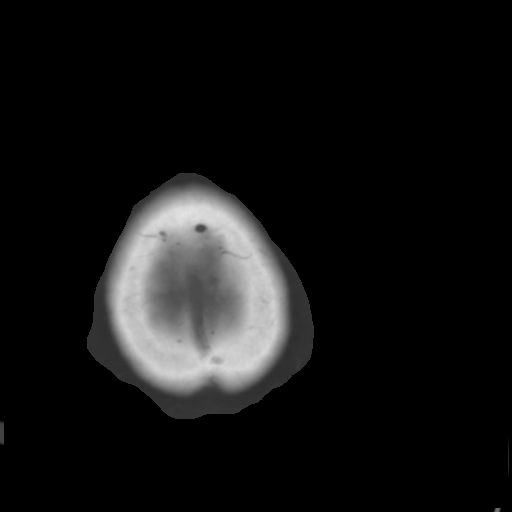

[13 of 30 positions shown; findings below may reference images not displayed]

FINDINGS: There is a 2.1 cm hyperdense metastasis in the left
parietal lobe which shows moderate enhancement and extensive
surrounding vasogenic edema.  There is a small metastasis in the
right temporal lobe measuring 7 mm in diameter.  This demonstrates
only minimal surrounding vasogenic edema.  There is no midline
shift, hydrocephalus or extra-axial fluid collection.

The visualized paranasal sinuses are clear.  The calvarium is
intact.  There are no suspicious osseous lesions.
IMPRESSION: 1.  Hyperdense left parietal and right temporal metastases typical
of melanoma.  The high density is typically due to Jumper,
although could reflect hemorrhage.  There is moderate surrounding
vasogenic edema.
2.  No midline shift or hydrocephalus.

This is a call report.

## 2011-12-19 IMAGING — US US ABDOMEN COMPLETE
1 series · 13 of 25 positions shown · non-contrast
Comparison: CT of the abdomen and pelvis performed 10/16/2010

CLINICAL DATA: Right lower quadrant abdominal pain and nausea.

ABDOMINAL ULTRASOUND COMPLETE

[Series 1: us abdomen complete · 0.22mm/px · 13 of 98 slices shown]
[im 1/98]
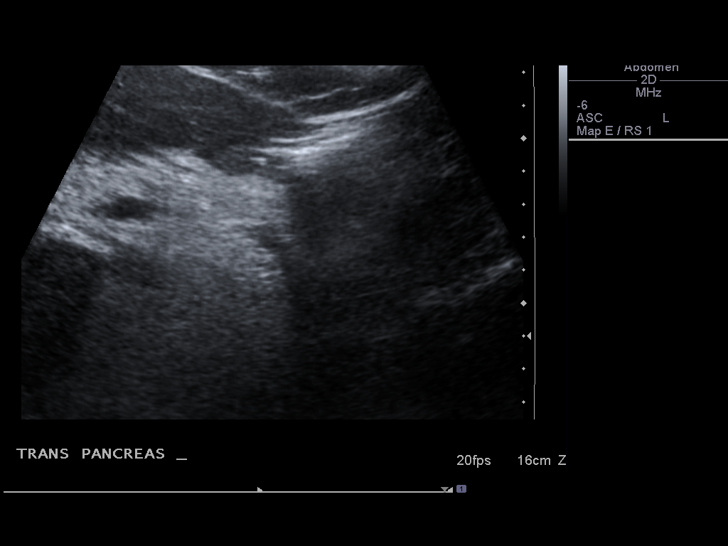
[im 9/98]
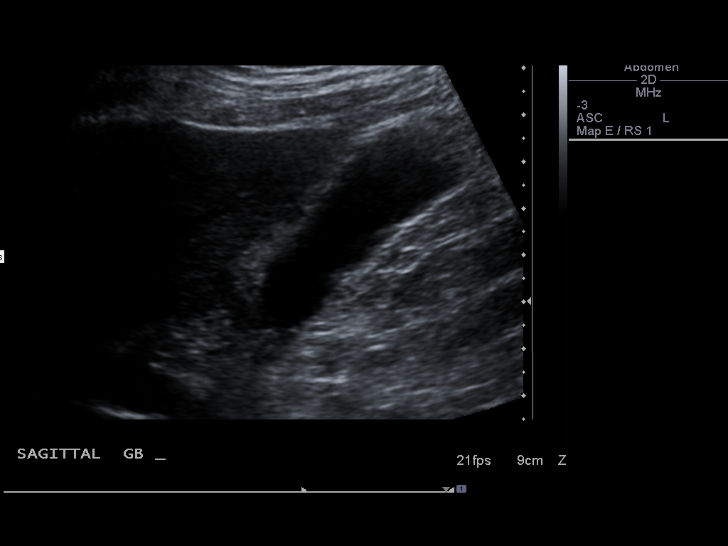
[im 17/98]
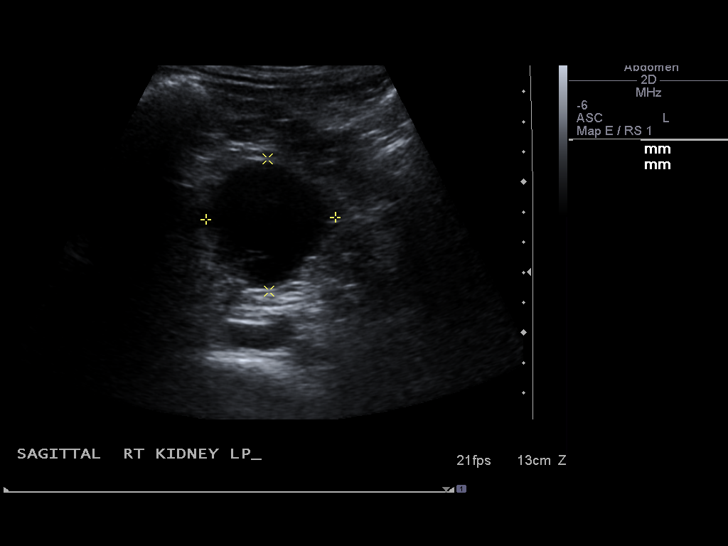
[im 25/98]
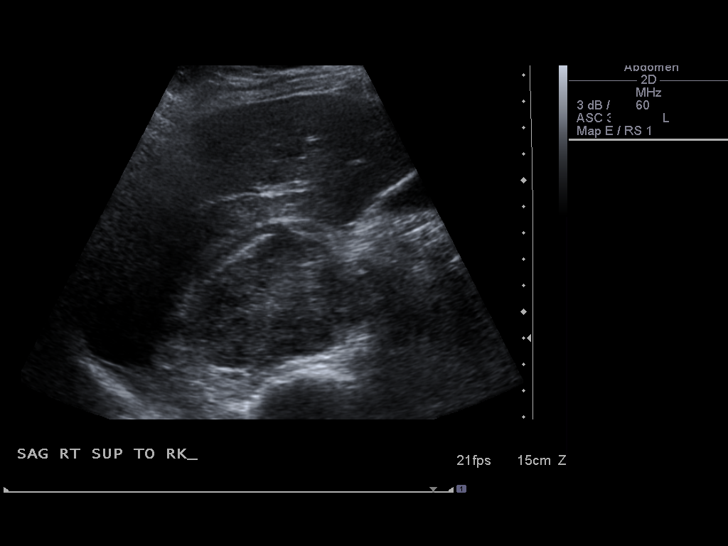
[im 33/98]
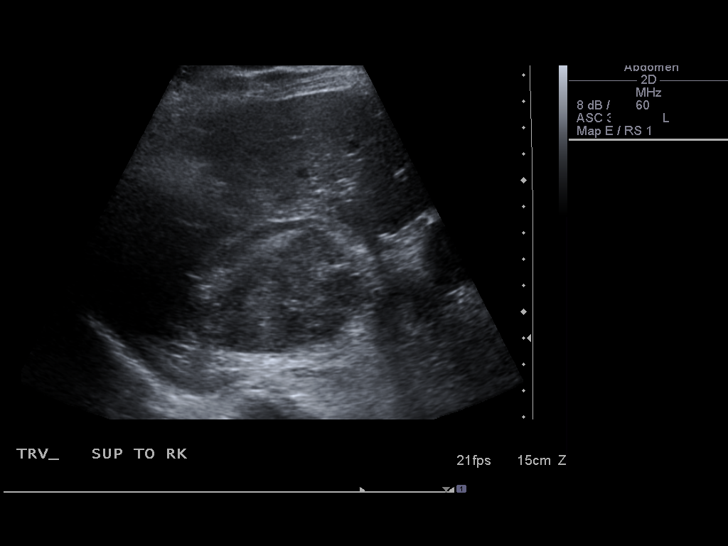
[im 41/98]
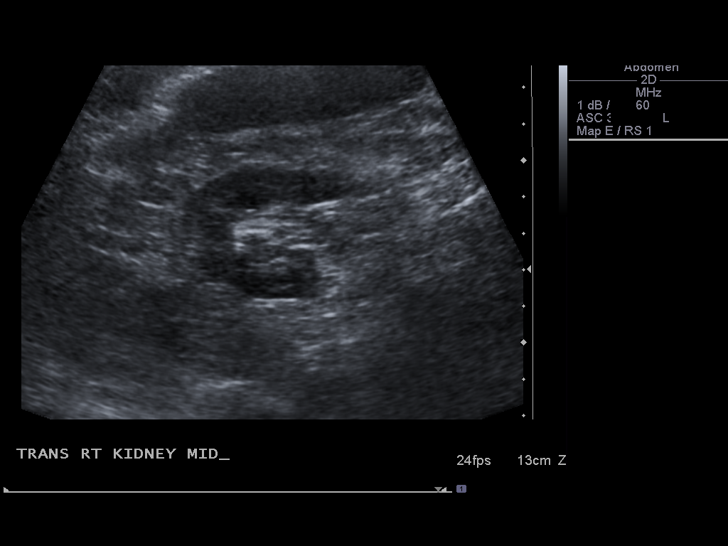
[im 49/98]
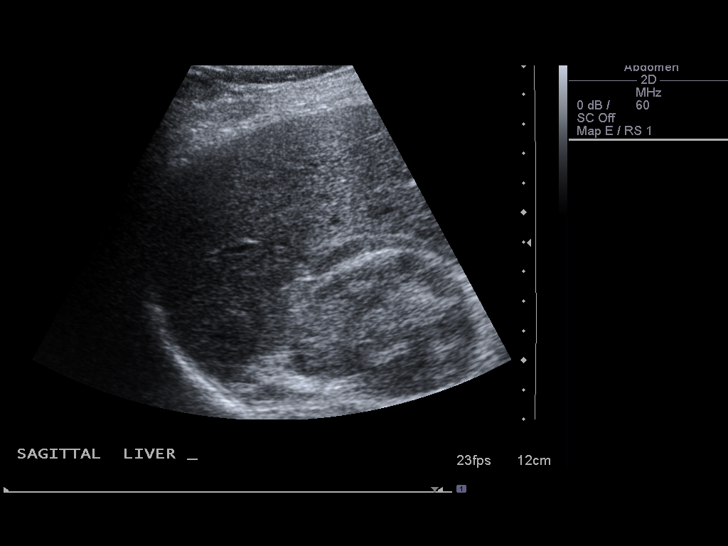
[im 57/98]
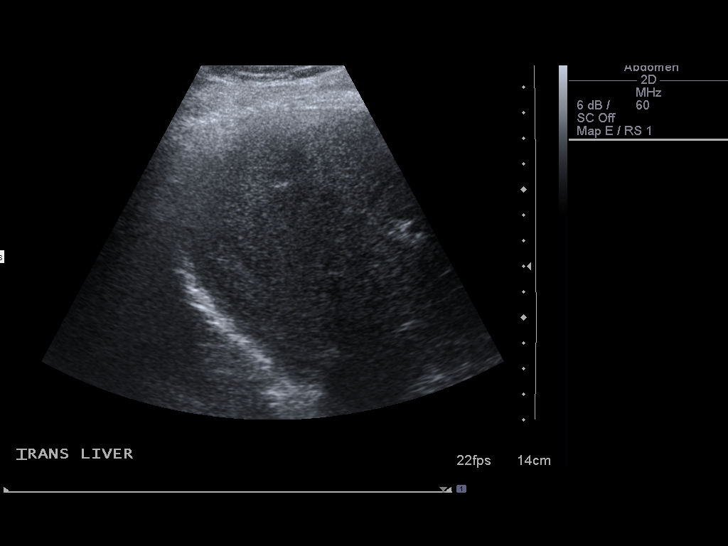
[im 65/98]
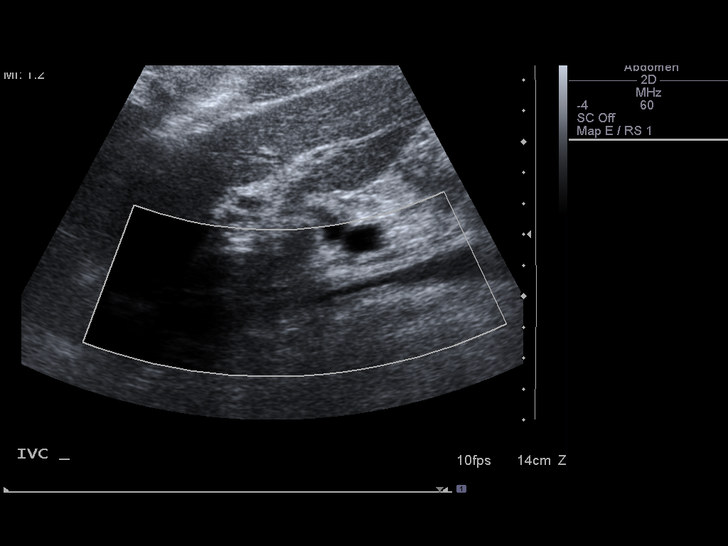
[im 73/98]
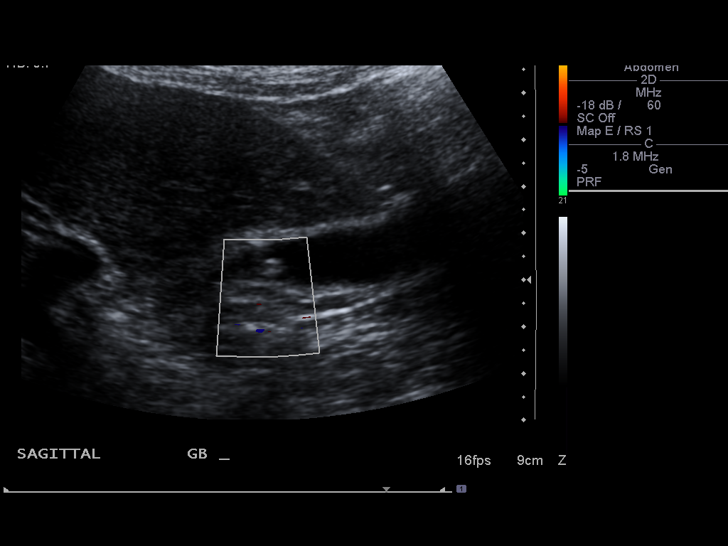
[im 81/98]
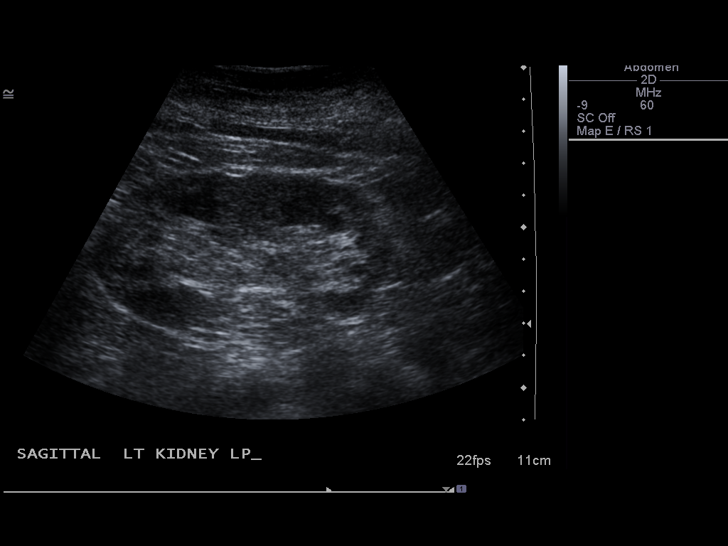
[im 89/98]
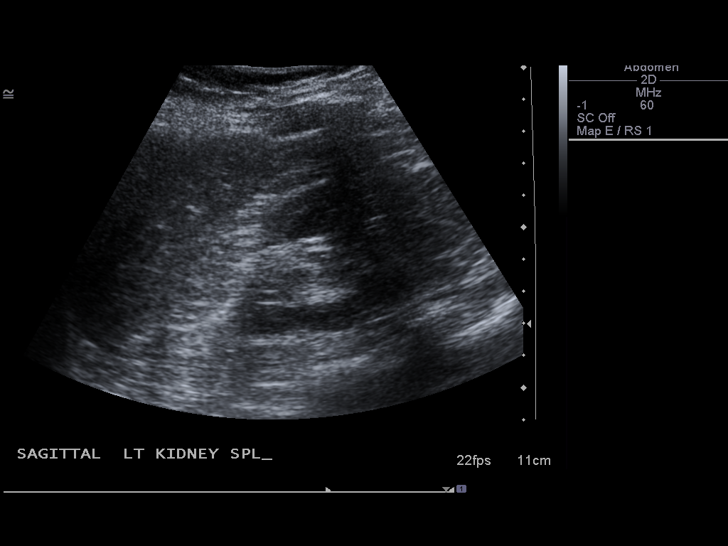
[im 98/98]
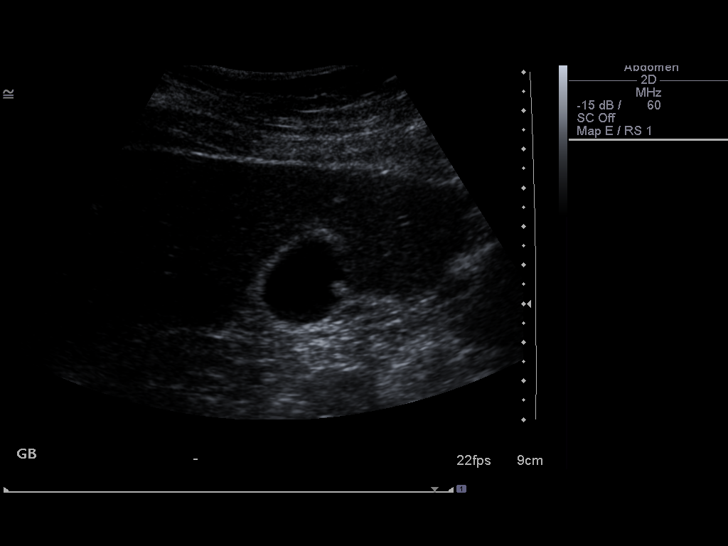

[13 of 25 positions shown; findings below may reference images not displayed]

FINDINGS: Gallbladder: Two non-mobile foci of increased echogenicity are
noted within the gallbladder, measuring 0.4 cm and 0.3 cm in size,
compatible with polyps.  Neither of these demonstrates posterior
acoustic shadowing.  Both polyps are seen near the base of the
gallbladder.

The gallbladder is otherwise unremarkable in appearance.  No
significant gallbladder wall thickening or pericholecystic fluid is
seen to suggest cholecystitis.  No ultrasonographic Murphy's sign
is elicited.

Common Bile Duct:  0.4 cm in diameter; within normal limits in
caliber.

Liver:  Normal parenchymal echogenicity and echotexture; no focal
lesions identified.  Limited Doppler evaluation demonstrates normal
blood flow within the liver.

IVC:  Unremarkable in appearance.  Not well characterized due to
overlying structures.

Pancreas:  Although the pancreas is difficult to visualize in its
entirety due to overlying bowel gas, no focal pancreatic
abnormality is identified.

Spleen:  6.3 cm in length; within normal limits in size and
echotexture.

A 7.7 x 6.7 x 5.1 cm heterogeneous solid mass is seen superior to
the right kidney; given the patient's history and findings on prior
studies, this is compatible with an enlarging right adrenal
metastasis.

Right kidney:  10.1 cm in length; normal in size, configuration and
parenchymal echogenicity.  No evidence of hydronephrosis.  A mildly
complex right renal cyst is noted at the lower pole, measuring
x 4.3 x 3.8 cm, with a single thin septation.  This remains
relatively anechoic in nature.

Left kidney:  9.8 cm in length; normal in size, configuration and
parenchymal echogenicity.  No evidence of mass or hydronephrosis.

Abdominal Aorta:  Normal in caliber; no aneurysm identified.  The
common iliac arteries are not characterized due to overlying bowel
gas.
IMPRESSION: 1.  Significant interval increase in size of heterogeneous solid
mass superior to the right kidney.  It now measures 7.7 x 6.7 x
cm, and is compatible with an enlarging right adrenal metastasis
from the patient's known melanoma.
2.  Mildly complex right renal cyst noted, measuring 4.4 cm; this
appears relatively stable from the prior CT.
3.  Two small polyps noted within the gallbladder; gallbladder
otherwise unremarkable in appearance.
